# Patient Record
Sex: Female | Born: 1980 | Race: Black or African American | Hispanic: No | Marital: Married | Smoking: Never smoker
Health system: Southern US, Community
[De-identification: ages and names within clinical notes are randomized; demographics above are authoritative.]

## PROBLEM LIST (undated history)

## (undated) DIAGNOSIS — J45909 Unspecified asthma, uncomplicated: Secondary | ICD-10-CM

## (undated) DIAGNOSIS — F419 Anxiety disorder, unspecified: Secondary | ICD-10-CM

## (undated) DIAGNOSIS — Z973 Presence of spectacles and contact lenses: Secondary | ICD-10-CM

## (undated) DIAGNOSIS — F32A Depression, unspecified: Secondary | ICD-10-CM

## (undated) DIAGNOSIS — D509 Iron deficiency anemia, unspecified: Secondary | ICD-10-CM

## (undated) DIAGNOSIS — M26629 Arthralgia of temporomandibular joint, unspecified side: Secondary | ICD-10-CM

## (undated) DIAGNOSIS — N840 Polyp of corpus uteri: Secondary | ICD-10-CM

## (undated) DIAGNOSIS — R011 Cardiac murmur, unspecified: Secondary | ICD-10-CM

## (undated) DIAGNOSIS — J302 Other seasonal allergic rhinitis: Secondary | ICD-10-CM

## (undated) DIAGNOSIS — N921 Excessive and frequent menstruation with irregular cycle: Secondary | ICD-10-CM

## (undated) DIAGNOSIS — F329 Major depressive disorder, single episode, unspecified: Secondary | ICD-10-CM

## (undated) DIAGNOSIS — Z9884 Bariatric surgery status: Secondary | ICD-10-CM

## (undated) DIAGNOSIS — K219 Gastro-esophageal reflux disease without esophagitis: Secondary | ICD-10-CM

## (undated) HISTORY — PX: WISDOM TOOTH EXTRACTION: SHX21

## (undated) HISTORY — PX: LAPAROSCOPIC GASTRIC SLEEVE RESECTION: SHX5895

---

## 2012-03-10 ENCOUNTER — Other Ambulatory Visit: Payer: Self-pay | Admitting: Allergy

## 2012-03-10 ENCOUNTER — Ambulatory Visit
Admission: RE | Admit: 2012-03-10 | Discharge: 2012-03-10 | Disposition: A | Discharge status: Home / Self Care | Payer: BC Managed Care – PPO | Source: Ambulatory Visit | Attending: Allergy | Admitting: Allergy

## 2012-03-10 DIAGNOSIS — J209 Acute bronchitis, unspecified: Secondary | ICD-10-CM

## 2014-01-04 ENCOUNTER — Other Ambulatory Visit: Payer: Self-pay | Admitting: Obstetrics & Gynecology

## 2014-01-12 ENCOUNTER — Encounter (HOSPITAL_COMMUNITY): Payer: Self-pay

## 2014-01-12 ENCOUNTER — Encounter (HOSPITAL_COMMUNITY)
Admission: RE | Admit: 2014-01-12 | Discharge: 2014-01-12 | Disposition: A | Discharge status: Home / Self Care | Payer: 59 | Source: Ambulatory Visit | Attending: Obstetrics & Gynecology | Admitting: Obstetrics & Gynecology

## 2014-01-12 DIAGNOSIS — Z01812 Encounter for preprocedural laboratory examination: Secondary | ICD-10-CM | POA: Diagnosis present

## 2014-01-12 HISTORY — DX: Anxiety disorder, unspecified: F41.9

## 2014-01-12 HISTORY — DX: Other seasonal allergic rhinitis: J30.2

## 2014-01-12 HISTORY — DX: Depression, unspecified: F32.A

## 2014-01-12 HISTORY — DX: Major depressive disorder, single episode, unspecified: F32.9

## 2014-01-12 LAB — CBC
HCT: 35.4 % — ABNORMAL LOW (ref 36.0–46.0)
Hemoglobin: 11.8 g/dL — ABNORMAL LOW (ref 12.0–15.0)
MCH: 28.4 pg (ref 26.0–34.0)
MCHC: 33.3 g/dL (ref 30.0–36.0)
MCV: 85.1 fL (ref 78.0–100.0)
PLATELETS: 274 10*3/uL (ref 150–400)
RBC: 4.16 MIL/uL (ref 3.87–5.11)
RDW: 14.1 % (ref 11.5–15.5)
WBC: 8.1 10*3/uL (ref 4.0–10.5)

## 2014-01-12 NOTE — Patient Instructions (Addendum)
   Your procedure is scheduled on: Monday, Nov 16th  Enter through the Main Entrance of [facility] at: 11:30 AM Pick up the phone at the desk and dial [phone] and inform us of your arrival.  Please call this number if you have any problems the morning of surgery: [phone]  Remember:  Do not eat food after midnight: Sunday Do not drink clear liquids after: 6 AM Monday, day of surgery Take these medicines the morning of surgery with a SIP OF WATER: Ativan, flonase and Qvar Inhaler if needed.  Bring albuterol inhaler with you on day of surgery.  Do not wear jewelry, make-up, or FINGER nail polish No metal in your hair or on your body. Do not wear lotions, powders, perfumes.  You may wear deodorant.  Do not bring valuables to the hospital. Contacts, dentures or bridgework may not be worn into surgery.  Leave suitcase in the car. After Surgery it may be brought to your room. For patients being admitted to the hospital, checkout time is 11:00am the day of discharge.    Patients discharged on the day of surgery will not be allowed to drive home.  Home with partner Janett Billow cell [phone].

## 2014-01-15 MED ORDER — CEFAZOLIN SODIUM 10 G IJ SOLR
3.0000 g | INTRAMUSCULAR | Status: AC
  Administered 2014-01-16: 3 g via INTRAVENOUS
  Filled 2014-01-15: qty 3000

## 2014-01-16 ENCOUNTER — Ambulatory Visit (HOSPITAL_COMMUNITY)
Admission: RE | Admit: 2014-01-16 | Discharge: 2014-01-16 | Disposition: A | Discharge status: Home / Self Care | Payer: 59 | Source: Ambulatory Visit | Attending: Obstetrics & Gynecology | Admitting: Obstetrics & Gynecology

## 2014-01-16 ENCOUNTER — Ambulatory Visit (HOSPITAL_COMMUNITY): Payer: 59 | Admitting: Certified Registered Nurse Anesthetist

## 2014-01-16 ENCOUNTER — Encounter (HOSPITAL_COMMUNITY): Payer: Self-pay | Admitting: Certified Registered Nurse Anesthetist

## 2014-01-16 ENCOUNTER — Encounter (HOSPITAL_COMMUNITY)
Admission: RE | Disposition: A | Discharge status: Home / Self Care | Payer: Self-pay | Source: Ambulatory Visit | Attending: Obstetrics & Gynecology

## 2014-01-16 DIAGNOSIS — F329 Major depressive disorder, single episode, unspecified: Secondary | ICD-10-CM | POA: Diagnosis not present

## 2014-01-16 DIAGNOSIS — D271 Benign neoplasm of left ovary: Secondary | ICD-10-CM | POA: Diagnosis not present

## 2014-01-16 DIAGNOSIS — F419 Anxiety disorder, unspecified: Secondary | ICD-10-CM | POA: Insufficient documentation

## 2014-01-16 DIAGNOSIS — J45909 Unspecified asthma, uncomplicated: Secondary | ICD-10-CM | POA: Insufficient documentation

## 2014-01-16 DIAGNOSIS — D649 Anemia, unspecified: Secondary | ICD-10-CM | POA: Diagnosis not present

## 2014-01-16 DIAGNOSIS — N8320 Unspecified ovarian cysts: Secondary | ICD-10-CM | POA: Diagnosis present

## 2014-01-16 HISTORY — PX: ROBOTIC ASSISTED LAPAROSCOPIC OVARIAN CYSTECTOMY: SHX6081

## 2014-01-16 LAB — TYPE AND SCREEN
ABO/RH(D): O POS
ANTIBODY SCREEN: NEGATIVE

## 2014-01-16 LAB — PREGNANCY, URINE: Preg Test, Ur: NEGATIVE

## 2014-01-16 LAB — ABO/RH: ABO/RH(D): O POS

## 2014-01-16 SURGERY — ROBOTIC ASSISTED LAPAROSCOPIC OVARIAN CYSTECTOMY
Anesthesia: General | Site: Abdomen | Laterality: Left

## 2014-01-16 MED ORDER — MIDAZOLAM HCL 2 MG/2ML IJ SOLN
INTRAMUSCULAR | Status: DC | PRN
  Administered 2014-01-16: 2 mg via INTRAVENOUS

## 2014-01-16 MED ORDER — SCOPOLAMINE 1 MG/3DAYS TD PT72
MEDICATED_PATCH | TRANSDERMAL | Status: AC
  Filled 2014-01-16: qty 1

## 2014-01-16 MED ORDER — KETOROLAC TROMETHAMINE 30 MG/ML IJ SOLN
INTRAMUSCULAR | Status: AC
  Filled 2014-01-16: qty 1

## 2014-01-16 MED ORDER — PROPOFOL 10 MG/ML IV BOLUS
INTRAVENOUS | Status: DC | PRN
  Administered 2014-01-16: 200 mg via INTRAVENOUS

## 2014-01-16 MED ORDER — BUPIVACAINE HCL (PF) 0.25 % IJ SOLN
INTRAMUSCULAR | Status: AC
  Filled 2014-01-16: qty 60

## 2014-01-16 MED ORDER — FENTANYL CITRATE 0.05 MG/ML IJ SOLN
INTRAMUSCULAR | Status: AC
  Filled 2014-01-16: qty 5

## 2014-01-16 MED ORDER — MEPERIDINE HCL 25 MG/ML IJ SOLN: 6.2500 mg | INTRAMUSCULAR | Status: DC | PRN

## 2014-01-16 MED ORDER — NEOSTIGMINE METHYLSULFATE 10 MG/10ML IV SOLN
INTRAVENOUS | Status: DC | PRN
  Administered 2014-01-16: 5 mg via INTRAVENOUS

## 2014-01-16 MED ORDER — ARTIFICIAL TEARS OP OINT
TOPICAL_OINTMENT | OPHTHALMIC | Status: AC
  Filled 2014-01-16: qty 3.5

## 2014-01-16 MED ORDER — ONDANSETRON HCL 4 MG/2ML IJ SOLN
INTRAMUSCULAR | Status: DC | PRN
  Administered 2014-01-16: 4 mg via INTRAVENOUS

## 2014-01-16 MED ORDER — KETOROLAC TROMETHAMINE 30 MG/ML IJ SOLN
INTRAMUSCULAR | Status: DC | PRN
  Administered 2014-01-16: 30 mg via INTRAVENOUS

## 2014-01-16 MED ORDER — LIDOCAINE HCL (CARDIAC) 20 MG/ML IV SOLN
INTRAVENOUS | Status: DC | PRN
  Administered 2014-01-16: 100 mg via INTRAVENOUS

## 2014-01-16 MED ORDER — HYDROCODONE-IBUPROFEN 5-200 MG PO TABS: 1.0000 | ORAL_TABLET | Freq: Three times a day (TID) | ORAL | Status: DC | PRN

## 2014-01-16 MED ORDER — LACTATED RINGERS IR SOLN
Status: DC | PRN
  Administered 2014-01-16: 3000 mL

## 2014-01-16 MED ORDER — GLYCOPYRROLATE 0.2 MG/ML IJ SOLN
INTRAMUSCULAR | Status: AC
  Filled 2014-01-16: qty 4

## 2014-01-16 MED ORDER — PROPOFOL 10 MG/ML IV EMUL
INTRAVENOUS | Status: AC
  Filled 2014-01-16: qty 20

## 2014-01-16 MED ORDER — DEXAMETHASONE SODIUM PHOSPHATE 10 MG/ML IJ SOLN
INTRAMUSCULAR | Status: DC | PRN
  Administered 2014-01-16: 4 mg via INTRAVENOUS

## 2014-01-16 MED ORDER — MIDAZOLAM HCL 2 MG/2ML IJ SOLN
INTRAMUSCULAR | Status: AC
  Filled 2014-01-16: qty 2

## 2014-01-16 MED ORDER — ROCURONIUM BROMIDE 100 MG/10ML IV SOLN
INTRAVENOUS | Status: AC
  Filled 2014-01-16: qty 1

## 2014-01-16 MED ORDER — HYDROMORPHONE HCL 1 MG/ML IJ SOLN
INTRAMUSCULAR | Status: AC
  Filled 2014-01-16: qty 1

## 2014-01-16 MED ORDER — FENTANYL CITRATE 0.05 MG/ML IJ SOLN
INTRAMUSCULAR | Status: DC | PRN
  Administered 2014-01-16: 100 ug via INTRAVENOUS
  Administered 2014-01-16 (×3): 50 ug via INTRAVENOUS
  Administered 2014-01-16: 100 ug via INTRAVENOUS

## 2014-01-16 MED ORDER — KETOROLAC TROMETHAMINE 30 MG/ML IJ SOLN: 15.0000 mg | Freq: Once | INTRAMUSCULAR | Status: AC | PRN

## 2014-01-16 MED ORDER — SCOPOLAMINE 1 MG/3DAYS TD PT72
1.0000 | MEDICATED_PATCH | Freq: Once | TRANSDERMAL | Status: DC
  Administered 2014-01-16: 1.5 mg via TRANSDERMAL

## 2014-01-16 MED ORDER — FENTANYL CITRATE 0.05 MG/ML IJ SOLN
25.0000 ug | INTRAMUSCULAR | Status: DC | PRN
  Administered 2014-01-16: 25 ug via INTRAVENOUS
  Administered 2014-01-16: 50 ug via INTRAVENOUS
  Administered 2014-01-16: 25 ug via INTRAVENOUS

## 2014-01-16 MED ORDER — PROMETHAZINE HCL 25 MG/ML IJ SOLN: 6.2500 mg | INTRAMUSCULAR | Status: DC | PRN

## 2014-01-16 MED ORDER — DEXAMETHASONE SODIUM PHOSPHATE 4 MG/ML IJ SOLN
INTRAMUSCULAR | Status: AC
  Filled 2014-01-16: qty 1

## 2014-01-16 MED ORDER — LACTATED RINGERS IV SOLN
INTRAVENOUS | Status: DC
Start: 2014-01-16 — End: 2014-01-17
  Administered 2014-01-16 (×2): via INTRAVENOUS

## 2014-01-16 MED ORDER — VASOPRESSIN 20 UNIT/ML IV SOLN
INTRAVENOUS | Status: AC
  Filled 2014-01-16: qty 1

## 2014-01-16 MED ORDER — ONDANSETRON HCL 4 MG/2ML IJ SOLN
INTRAMUSCULAR | Status: AC
  Filled 2014-01-16: qty 2

## 2014-01-16 MED ORDER — HYDROMORPHONE HCL 1 MG/ML IJ SOLN: 0.2500 mg | INTRAMUSCULAR | Status: DC | PRN

## 2014-01-16 MED ORDER — HYDROMORPHONE HCL 1 MG/ML IJ SOLN
INTRAMUSCULAR | Status: DC | PRN
  Administered 2014-01-16 (×2): 0.5 mg via INTRAVENOUS

## 2014-01-16 MED ORDER — MIDAZOLAM HCL 2 MG/2ML IJ SOLN: 0.5000 mg | Freq: Once | INTRAMUSCULAR | Status: AC | PRN

## 2014-01-16 MED ORDER — NEOSTIGMINE METHYLSULFATE 10 MG/10ML IV SOLN
INTRAVENOUS | Status: AC
  Filled 2014-01-16: qty 1

## 2014-01-16 MED ORDER — FENTANYL CITRATE 0.05 MG/ML IJ SOLN
INTRAMUSCULAR | Status: AC
  Filled 2014-01-16: qty 2

## 2014-01-16 MED ORDER — FENTANYL CITRATE 0.05 MG/ML IJ SOLN
INTRAMUSCULAR | Status: AC
  Administered 2014-01-16: 25 ug via INTRAVENOUS
  Filled 2014-01-16: qty 2

## 2014-01-16 MED ORDER — ROCURONIUM BROMIDE 100 MG/10ML IV SOLN
INTRAVENOUS | Status: DC | PRN
  Administered 2014-01-16 (×2): 10 mg via INTRAVENOUS
  Administered 2014-01-16: 50 mg via INTRAVENOUS

## 2014-01-16 MED ORDER — GLYCOPYRROLATE 0.2 MG/ML IJ SOLN
INTRAMUSCULAR | Status: DC | PRN
  Administered 2014-01-16: .8 mg via INTRAVENOUS

## 2014-01-16 MED ORDER — BUPIVACAINE HCL (PF) 0.25 % IJ SOLN
INTRAMUSCULAR | Status: DC | PRN
  Administered 2014-01-16: 30 mL

## 2014-01-16 MED ORDER — LIDOCAINE HCL (CARDIAC) 20 MG/ML IV SOLN
INTRAVENOUS | Status: AC
  Filled 2014-01-16: qty 5

## 2014-01-16 SURGICAL SUPPLY — 62 items
BARRIER ADHS 3X4 INTERCEED (GAUZE/BANDAGES/DRESSINGS) IMPLANT
CHLORAPREP W/TINT 26ML (MISCELLANEOUS) ×4 IMPLANT
CLOTH BEACON ORANGE TIMEOUT ST (SAFETY) ×4 IMPLANT
CONT PATH 16OZ SNAP LID 3702 (MISCELLANEOUS) ×4 IMPLANT
COVER BACK TABLE 60X90IN (DRAPES) ×8 IMPLANT
COVER TIP SHEARS 8 DVNC (MISCELLANEOUS) ×2 IMPLANT
COVER TIP SHEARS 8MM DA VINCI (MISCELLANEOUS) ×2
DECANTER SPIKE VIAL GLASS SM (MISCELLANEOUS) ×4 IMPLANT
DRAPE HUG U DISPOSABLE (DRAPE) ×4 IMPLANT
DRAPE WARM FLUID 44X44 (DRAPE) ×4 IMPLANT
DRSG COVADERM PLUS 2X2 (GAUZE/BANDAGES/DRESSINGS) ×16 IMPLANT
DRSG OPSITE POSTOP 3X4 (GAUZE/BANDAGES/DRESSINGS) ×4 IMPLANT
ELECT REM PT RETURN 9FT ADLT (ELECTROSURGICAL) ×4
ELECTRODE REM PT RTRN 9FT ADLT (ELECTROSURGICAL) ×2 IMPLANT
EVACUATOR SMOKE 8.L (FILTER) ×4 IMPLANT
GAUZE SPONGE 4X4 16PLY XRAY LF (GAUZE/BANDAGES/DRESSINGS) ×4 IMPLANT
GAUZE VASELINE 3X9 (GAUZE/BANDAGES/DRESSINGS) IMPLANT
GLOVE BIO SURGEON STRL SZ 6.5 (GLOVE) ×3 IMPLANT
GLOVE BIO SURGEONS STRL SZ 6.5 (GLOVE) ×1
GLOVE BIOGEL PI IND STRL 7.0 (GLOVE) ×2 IMPLANT
GLOVE BIOGEL PI INDICATOR 7.0 (GLOVE) ×2
GOWN STRL REUS W/TWL LRG LVL3 (GOWN DISPOSABLE) ×32 IMPLANT
IV STOPCOCK 4 WAY 40  W/Y SET (IV SOLUTION) ×2
IV STOPCOCK 4 WAY 40 W/Y SET (IV SOLUTION) ×2 IMPLANT
KIT ACCESSORY DA VINCI DISP (KITS) ×2
KIT ACCESSORY DVNC DISP (KITS) ×2 IMPLANT
LIQUID BAND (GAUZE/BANDAGES/DRESSINGS) ×8 IMPLANT
NEEDLE HYPO 22GX1.5 SAFETY (NEEDLE) ×4 IMPLANT
OCCLUDER COLPOPNEUMO (BALLOONS) IMPLANT
PACK ROBOT WH (CUSTOM PROCEDURE TRAY) ×4 IMPLANT
PAD PREP 24X48 CUFFED NSTRL (MISCELLANEOUS) ×8 IMPLANT
PAD TRENDELENBURG POSITION (MISCELLANEOUS) ×4 IMPLANT
POUCH SPECIMEN RETRIEVAL 10MM (ENDOMECHANICALS) ×4 IMPLANT
PROTECTOR NERVE ULNAR (MISCELLANEOUS) ×8 IMPLANT
SET CYSTO W/LG BORE CLAMP LF (SET/KITS/TRAYS/PACK) IMPLANT
SET IRRIG TUBING LAPAROSCOPIC (IRRIGATION / IRRIGATOR) ×4 IMPLANT
SUT VIC AB 0 CT1 27 (SUTURE)
SUT VIC AB 0 CT1 27XBRD ANTBC (SUTURE) IMPLANT
SUT VIC AB 0 CT2 27 (SUTURE) IMPLANT
SUT VIC AB 2-0 CT1 27 (SUTURE)
SUT VIC AB 2-0 CT1 TAPERPNT 27 (SUTURE) IMPLANT
SUT VIC AB 3-0 SH 27 (SUTURE)
SUT VIC AB 3-0 SH 27X BRD (SUTURE) IMPLANT
SUT VIC AB 4-0 PS2 27 (SUTURE) ×4 IMPLANT
SUT VICRYL 0 UR6 27IN ABS (SUTURE) ×8 IMPLANT
SYR 50ML LL SCALE MARK (SYRINGE) ×4 IMPLANT
SYSTEM CONVERTIBLE TROCAR (TROCAR) IMPLANT
TIP UTERINE 5.1X6CM LAV DISP (MISCELLANEOUS) IMPLANT
TIP UTERINE 6.7X10CM GRN DISP (MISCELLANEOUS) IMPLANT
TIP UTERINE 6.7X6CM WHT DISP (MISCELLANEOUS) IMPLANT
TIP UTERINE 6.7X8CM BLUE DISP (MISCELLANEOUS) ×4 IMPLANT
TOWEL OR 17X24 6PK STRL BLUE (TOWEL DISPOSABLE) ×16 IMPLANT
TRAY FOLEY BAG SILVER LF 14FR (CATHETERS) ×4 IMPLANT
TROCAR 12M 150ML BLUNT (TROCAR) IMPLANT
TROCAR 5M 150ML BLDLS (TROCAR) ×4 IMPLANT
TROCAR DISP BLADELESS 8 DVNC (TROCAR) ×2 IMPLANT
TROCAR DISP BLADELESS 8MM (TROCAR) ×2
TROCAR OPTI TIP 12M 100M (ENDOMECHANICALS) IMPLANT
TROCAR XCEL 12X100 BLDLESS (ENDOMECHANICALS) ×4 IMPLANT
TROCAR XCEL NON-BLD 5MMX100MML (ENDOMECHANICALS) ×4 IMPLANT
WARMER LAPAROSCOPE (MISCELLANEOUS) ×4 IMPLANT
WATER STERILE IRR 1000ML POUR (IV SOLUTION) ×12 IMPLANT

## 2014-01-16 NOTE — Anesthesia Preprocedure Evaluation (Signed)
Anesthesia Evaluation  Patient identified by MRN, date of birth, ID band Patient awake    Reviewed: Allergy & Precautions, H&P , NPO status , Patient's Chart, lab work & pertinent test results  Airway Mallampati: I  TM Distance: >3 FB Neck ROM: full    Dental no notable dental hx. (+) Teeth Intact   Pulmonary    Pulmonary exam normal       Cardiovascular negative cardio ROS      Neuro/Psych    GI/Hepatic negative GI ROS, Neg liver ROS,   Endo/Other  Morbid obesity  Renal/GU negative Renal ROS     Musculoskeletal   Abdominal Normal abdominal exam  (+) + obese,   Peds  Hematology negative hematology ROS (+)   Anesthesia Other Findings   Reproductive/Obstetrics negative OB ROS                             Anesthesia Physical Anesthesia Plan  ASA: III  Anesthesia Plan: General   Post-op Pain Management:    Induction:   Airway Management Planned: Oral ETT  Additional Equipment:   Intra-op Plan:   Post-operative Plan: Extubation in OR  Informed Consent: I have reviewed the patients History and Physical, chart, labs and discussed the procedure including the risks, benefits and alternatives for the proposed anesthesia with the patient or authorized representative who has indicated his/her understanding and acceptance.   Dental Advisory Given  Plan Discussed with: CRNA and Surgeon  Anesthesia Plan Comments:         Anesthesia Quick Evaluation

## 2014-01-16 NOTE — Anesthesia Postprocedure Evaluation (Signed)
  Anesthesia Post Note  Patient: Patricia Giles  Procedure(s) Performed: Procedure(s) (LRB): ROBOTIC ASSISTED LAPAROSCOPIC OVARIAN CYSTECTOMY WITH PERITONEAL WASHINGS (Left)  Anesthesia type: GA  Patient location: PACU  Post pain: Pain level controlled  Post assessment: Post-op Vital signs reviewed  Last Vitals:  Filed Vitals:   01/16/14 1530  BP: 121/65  Pulse: 83  Temp:   Resp: 16    Post vital signs: Reviewed  Level of consciousness: sedated  Complications: No apparent anesthesia complications

## 2014-01-16 NOTE — Discharge Summary (Signed)
  Physician Discharge Summary  Patient ID: Patricia Giles MRN: [id number] DOB/AGE: Sep 07, 1980 33 y.o.  Admit date: 01/16/2014 Discharge date: 01/16/2014  Admission Diagnoses: Left complex ovarian cyst  Discharge Diagnoses: same, probable left Dermoid Ovarian Cyst        Active Problems:   * No active hospital problems. *   Discharged Condition: good  Hospital Course:  Outpatient   Consults: None  Treatments: surgery: Robotic Left Ovarian Cystectomy, Peritoneal washings  Disposition: Final discharge disposition not confirmed     Medication List    TAKE these medications        albuterol 108 (90 BASE) MCG/ACT inhaler  Commonly known as:  PROVENTIL HFA;VENTOLIN HFA  Inhale 1 puff into the lungs every 6 (six) hours as needed for wheezing or shortness of breath.     beclomethasone 40 MCG/ACT inhaler  Commonly known as:  QVAR  Inhale 2 puffs into the lungs 2 (two) times daily.     cetirizine 10 MG tablet  Commonly known as:  ZYRTEC  Take 10 mg by mouth daily.     fluticasone 50 MCG/ACT nasal spray  Commonly known as:  FLONASE  Place 1 spray into both nostrils every morning.     human chorionic gonadotropin 10000 UNITS injection  Commonly known as:  PREGNYL/NOVAREL  Inject 15 Units into the muscle 2 (two) times a week.     hydrocodone-ibuprofen 5-200 MG per tablet  Commonly known as:  VICOPROFEN  Take 1 tablet by mouth every 8 (eight) hours as needed for pain.     levocetirizine 5 MG tablet  Commonly known as:  XYZAL  Take 5 mg by mouth every evening.     LORazepam 1 MG tablet  Commonly known as:  ATIVAN  Take 1 mg by mouth daily as needed for anxiety.     meloxicam 15 MG tablet  Commonly known as:  MOBIC  Take 15 mg by mouth daily.     methocarbamol 500 MG tablet  Commonly known as:  ROBAXIN  Take 500 mg by mouth every 8 (eight) hours as needed for muscle spasms (pain).     phentermine 37.5 MG capsule  Take 37.5 mg by mouth every morning.     VITAMIN  B-12 IJ  Inject 30 Units as directed once a week.           Follow-up Information    Follow up with Ashna Dorough,MARIE-LYNE, MD In 3 weeks.   Specialty:  Obstetrics and Gynecology   Contact information:   [place] [address] [phone]       Signed: [name], MD 01/16/2014, 3:35 PM

## 2014-01-16 NOTE — H&P (Signed)
Patricia Giles is an 32 y.o. female G47P2A2  RP:  Left complex ovarian cyst, probable Dermoid for Robotic Left Ovarian Cystectomy  Pertinent Gynecological History: Menses: flow is moderate, regular Contraception: condoms Blood transfusions: none Sexually transmitted diseases: no past history Previous GYN Procedures: C/S  Last mammogram: None Last pap: normal OB History: G4P2A2  Menstrual History:  No LMP recorded.    Past Medical History  Diagnosis Date  . Seasonal allergies   . Asthma   . Anxiety   . Depression   . Cubital tunnel syndrome     both left and right  . Anemia     Past Surgical History  Procedure Laterality Date  . Cesarean section  2012    x 1 in [place]  . Wisdom tooth extraction      No family history on file.  Social History:  reports that she has never smoked. She has never used smokeless tobacco. She reports that she drinks alcohol. She reports that she does not use illicit drugs.  Allergies:  Allergies  Allergen Reactions  . Bismuth-Containing Compounds Hives    No prescriptions prior to admission    ROS  There were no vitals taken for this visit. Physical Exam   Pelvic US 4 cm complex left ovarian cyst c/w Dermoid persisting x 2014.  No FF.  Uterus wnl. No results found for this or any previous visit (from the past 24 hour(s)).  No results found.  Assessment/Plan: Lt complex ovarian cyst persisting at 4 cm, probable Dermoid.  Robotic Left ovarian cystectomy, possible Lt Oophorectomy.  Surgery and risks reviewed including trauma, infection, hemorrhage, DVT/PE, Anesthesia Cx.  Patricia Giles,Patricia Giles 01/16/2014, 10:29 AM

## 2014-01-16 NOTE — Discharge Instructions (Signed)
Ovarian Cystectomy, Care After  Refer to this sheet in the next few weeks. These instructions provide you with information on caring for yourself after your procedure. Your health care provider may also give you more specific instructions. Your treatment has been planned according to current medical practices, but problems sometimes occur. Call your health care provider if you have any problems or questions after your procedure.   WHAT TO EXPECT AFTER THE PROCEDURE  After your procedure, it is typical to have the following:  · Pain in your abdomen, especially at the incision sites. You will be given pain medicines to control the pain.  · Tiredness. This is a normal part of the recovery process. Your energy level will return to normal over the next several weeks.  · Constipation.  HOME CARE INSTRUCTIONS  · Only take over-the-counter or prescription medicines as directed by your health care provider. Avoid taking aspirin because it can cause bleeding.    · Follow your health care provider's instructions for when to resume your regular diet, exercise, and activities.    · Take rest breaks during the day as needed.  · Do not douche or have sexual intercourse until you have permission from your health care provider.    · Remove or change any bandages (dressings) as directed by your health care provider.    · Do not drive until your health care provider approves.    · Take showers instead of baths until your health care provider tells you otherwise.    · If you become constipated, you may:    ¨ Use a mild laxative if your health care provider approves.  ¨ Add more fruit and bran to your diet.  ¨ Drink more fluids.  · Take your temperature twice a day and record it.    · Do not drink alcohol while taking pain medicine.    · Try to have someone home with you for the first 1-2 weeks to help with your household activities.    · Follow up with your health care provider as directed.  SEEK MEDICAL CARE IF:  · You have a fever.     · You feel sick to your stomach (nauseous) and throw up (vomit).    · You have redness, swelling, or leakage of fluid at the incision site.    · You have pain when you urinate or have blood in your urine.    · You have a rash on your body.    · You have pain or redness where the IV tube was inserted.    · You have pain that is not relieved with medicine.    SEEK IMMEDIATE MEDICAL CARE IF:  · You have chest pain or shortness of breath.    · You feel dizzy or lightheaded.    · You have increasing abdominal pain that is not relieved with medicines.    · You have pain, swelling, or redness in your leg.    · You see a yellowish white fluid (pus) coming from the incision.    · Your incision is opening (edges not staying together).    Document Released: 12/08/2012 Document Reviewed: 12/08/2012  Exit[employer]® Patient Information ©2015 [employer]. This information is not intended to replace advice given to you by your health care provider. Make sure you discuss any questions you have with your health care provider.

## 2014-01-16 NOTE — Op Note (Signed)
01/16/2014  3:12 PM  PATIENT:  Patricia Giles  33 y.o. female  PRE-OPERATIVE DIAGNOSIS:  Left complex ovarian cyst  POST-OPERATIVE DIAGNOSIS:  Same, c/w Left Dermoid Ovarian Cyst  PROCEDURE:  Procedure(s): ROBOTIC ASSISTED LAPAROSCOPIC OVARIAN CYSTECTOMY WITH PERITONEAL WASHINGS  SURGEON:  Surgeon(s): [name], MD  ASSISTANTS: Julianne Handler   ANESTHESIA:   general   PROCEDURE:  Under general anesthesia with endotracheal intubation the patient is an lithotomy position. She is prepped with ChloraPrep on the abdomen and with Betadine on the suprapubic, vulvar and vaginal areas. The Foley is inserted in the bladder.  The weighted speculum is inserted in the vagina and the anterior lip of the cervix was grasped with a tenaculum. Hysterometry is 8 cm, so we used a #8 Rumi with the small coring, those are put in place easily. The weighted speculum and the tenaculum were removed.  Abdomen elderly we infiltrate the supraumbilical area with Marcaine one quarter plane. We make a 1.5 cm incision at that level with the scalpel.  The adipose tissue was dissected bluntly and we grasped the aponeurosis with 2 clamps.  We opened the aponeurosis under direct vision with a Mayo scissor. We opened the parietal peritoneum bluntly with the finger. A pursestring stitch of Vicryl 0 was done on the aponeurosis. We inserted the [facility] at that level and created a pneumoperitoneum with CO2.  Inspection of the abdominal pelvic cavities revealed no abdominal pathology, the pelvis revealed a left ovarian cyst about 4 cm, the right ovary, both tubes and the uterus were normal in size and appearance.  We then proceeded with port placement using a semicircular configuration.  2 robotic ports were put in place on the right and one assistant port on the upper left with a robotic ports on the lower left.  Infiltration of Marcaine 1/4 plain was done at all sites and then we used to scalpel to make the small incisions. All  ports were inserted under direct vision.  The robot was docked on the right side with the patient in deep Trendelenburg.  The instruments were entered under direct vision with the EndoShears scissor in the first arm, the PK in the second arm and the fenestrated clamp in the third arm. We then went to the console.  We started with peritoneal washings.  We then proceeded with the left ovarian cystectomy by making an incision contralateral to the mesosalpinx with the tip of the Endo Shears scissors.  We then proceeded with the left ovarian cystectomy using traction and countertraction.  The cyst wall was very thin and was unintentionally ruptured revealing the content of a dermoid cyst with fat and hair.  We suctioned the contents of the dermoid cyst as much as possible as we proceeded with the cystectomy.  We then put the left ovarian cyst in the posterior cutis sac.  Excellent hemostasis of the left ovary was achieved with the PK.  We took pictures before and after the cystectomy.  We then removed all robotic instruments. The robot was undocked.  We continued by laparoscopy with the 8 mm camera.  We used an Endobag to remove the left ovarian cyst and its content.  The specimen was sent to pathology.  We then irrigated and suctioned the abdominopelvic cavities abundantly to make sure that no content of the dermoid cyst with be left behind.  Hemostasis was adequate at all levels.  All laparoscopic instruments were removed.  The ports were removed under direct vision.  The CO2 was evacuated.  We closed the supraumbilical incision by attaching the pursestring stitch.  We completed hemostasis with the Bovie.  All incisions were closed with a subcuticular stitch of Vicryl 4-0. Dermabond was added on all incisions.  The Rumi and coring were removed from the vagina. The patient was brought to recovery room in good and stable status.  ESTIMATED BLOOD LOSS:  15 cc   Intake/Output Summary (Last 24 hours) at 01/16/14  1512 Last data filed at 01/16/14 1505  Gross per 24 hour  Intake   1000 ml  Output    325 ml  Net    675 ml     BLOOD ADMINISTERED:none   LOCAL MEDICATIONS USED:  MARCAINE     SPECIMEN:  Source of Specimen:  Left ovarian cyst c/w Dermoid cyst  DISPOSITION OF SPECIMEN:  PATHOLOGY  COUNTS:  YES  PLAN OF CARE: Transfer to PACU  [name]  01/16/2014  At 3:13 pm

## 2014-01-16 NOTE — Transfer of Care (Signed)
Immediate Anesthesia Transfer of Care Note  Patient: Patricia Giles  Procedure(s) Performed: Procedure(s): ROBOTIC ASSISTED LAPAROSCOPIC OVARIAN CYSTECTOMY WITH PERITONEAL WASHINGS (Left)  Patient Location: PACU  Anesthesia Type:General  Level of Consciousness: awake, alert  and oriented  Airway & Oxygen Therapy: Patient Spontanous Breathing and Patient connected to nasal cannula oxygen  Post-op Assessment: Report given to PACU RN and Post -op Vital signs reviewed and stable  Post vital signs: Reviewed and stable  Complications: No apparent anesthesia complications

## 2014-01-17 ENCOUNTER — Encounter (HOSPITAL_COMMUNITY): Payer: Self-pay | Admitting: Obstetrics & Gynecology

## 2014-04-20 ENCOUNTER — Other Ambulatory Visit: Payer: Self-pay

## 2014-04-20 DIAGNOSIS — E282 Polycystic ovarian syndrome: Secondary | ICD-10-CM

## 2014-04-20 DIAGNOSIS — R1032 Left lower quadrant pain: Secondary | ICD-10-CM

## 2014-05-01 ENCOUNTER — Other Ambulatory Visit: Payer: Self-pay

## 2014-05-07 ENCOUNTER — Encounter (HOSPITAL_BASED_OUTPATIENT_CLINIC_OR_DEPARTMENT_OTHER): Payer: Self-pay | Admitting: Emergency Medicine

## 2014-05-07 DIAGNOSIS — F329 Major depressive disorder, single episode, unspecified: Secondary | ICD-10-CM | POA: Insufficient documentation

## 2014-05-07 DIAGNOSIS — Z7951 Long term (current) use of inhaled steroids: Secondary | ICD-10-CM | POA: Insufficient documentation

## 2014-05-07 DIAGNOSIS — J45909 Unspecified asthma, uncomplicated: Secondary | ICD-10-CM | POA: Diagnosis not present

## 2014-05-07 DIAGNOSIS — Z79899 Other long term (current) drug therapy: Secondary | ICD-10-CM | POA: Insufficient documentation

## 2014-05-07 DIAGNOSIS — F419 Anxiety disorder, unspecified: Secondary | ICD-10-CM | POA: Insufficient documentation

## 2014-05-07 DIAGNOSIS — Z791 Long term (current) use of non-steroidal anti-inflammatories (NSAID): Secondary | ICD-10-CM | POA: Diagnosis not present

## 2014-05-07 DIAGNOSIS — D649 Anemia, unspecified: Secondary | ICD-10-CM | POA: Diagnosis not present

## 2014-05-07 DIAGNOSIS — Z3202 Encounter for pregnancy test, result negative: Secondary | ICD-10-CM | POA: Insufficient documentation

## 2014-05-07 DIAGNOSIS — Z8669 Personal history of other diseases of the nervous system and sense organs: Secondary | ICD-10-CM | POA: Insufficient documentation

## 2014-05-07 DIAGNOSIS — R51 Headache: Secondary | ICD-10-CM | POA: Insufficient documentation

## 2014-05-07 NOTE — ED Notes (Signed)
Pt reports headache onset x 1 day ago that has worsened to the point she has blurred vision nausea denies emesis

## 2014-05-08 ENCOUNTER — Emergency Department (HOSPITAL_BASED_OUTPATIENT_CLINIC_OR_DEPARTMENT_OTHER)
Admission: EM | Admit: 2014-05-08 | Discharge: 2014-05-08 | Disposition: A | Discharge status: Home / Self Care | Payer: 59 | Attending: Emergency Medicine | Admitting: Emergency Medicine

## 2014-05-08 DIAGNOSIS — R51 Headache: Secondary | ICD-10-CM

## 2014-05-08 DIAGNOSIS — R519 Headache, unspecified: Secondary | ICD-10-CM

## 2014-05-08 LAB — PREGNANCY, URINE: Preg Test, Ur: NEGATIVE

## 2014-05-08 MED ORDER — DIPHENHYDRAMINE HCL 50 MG/ML IJ SOLN
25.0000 mg | Freq: Once | INTRAMUSCULAR | Status: AC
  Administered 2014-05-08: 25 mg via INTRAVENOUS
  Filled 2014-05-08: qty 1

## 2014-05-08 MED ORDER — METOCLOPRAMIDE HCL 5 MG/ML IJ SOLN
10.0000 mg | Freq: Once | INTRAMUSCULAR | Status: AC
  Administered 2014-05-08: 10 mg via INTRAVENOUS
  Filled 2014-05-08: qty 2

## 2014-05-08 MED ORDER — KETOROLAC TROMETHAMINE 30 MG/ML IJ SOLN
30.0000 mg | Freq: Once | INTRAMUSCULAR | Status: AC
  Administered 2014-05-08: 30 mg via INTRAVENOUS
  Filled 2014-05-08: qty 1

## 2014-05-08 MED ORDER — IBUPROFEN 800 MG PO TABS: 800.0000 mg | ORAL_TABLET | Freq: Three times a day (TID) | ORAL | Status: DC

## 2014-05-08 NOTE — ED Notes (Signed)
Ginger ale given to patient. 

## 2014-05-08 NOTE — ED Provider Notes (Signed)
CSN: [id number]     Arrival date & time 05/07/14  2113 History  This chart was scribed for Patricia Caba K [name], MD by Molli Posey, ED Scribe. This patient was seen in room MH01/MH01 and the patient's care was started 12:19 AM.   Chief Complaint  Patient presents with  . Headache   Patient is a 34 y.o. female presenting with headaches. The history is provided by the patient. No language interpreter was used.  Headache Location: yesterday was right sided todays is on the left. Quality:  Dull Radiates to:  Does not radiate Severity currently:  8/10 Severity at highest:  8/10 Onset quality:  Gradual Duration:  1 day Timing:  Constant Progression:  Unchanged Context: emotional stress   Context: not activity, not exposure to bright light and not eating   Relieved by:  Nothing Worsened by:  Nothing Ineffective treatments:  None tried Associated symptoms: nausea   Associated symptoms: no abdominal pain, no back pain, no blurred vision, no congestion, no dizziness, no fever, no focal weakness, no neck pain, no neck stiffness, no numbness, no paresthesias, no photophobia, no seizures, no sore throat, no swollen glands, no vomiting and no weakness   Associated symptoms comment:  No change in cognitions no rashes on weakness nor numbness no changes in speech Risk factors: does not have insomnia    HPI Comments: Patricia Giles is a 34 y.o. female with a history of asthma -anemia, seasonal allergies and depression who presents to the Emergency Department complaining of a right sided HA that started yesterday. She rates her HA as a 8/10 at this time. Pt states her vision got blurry during as pt was driving at the time. She describes her HA as an ache and says it occasionally throbs. Pt states her HA is worse when she is moving. She says that she took hydrocodone yesterday which failed to relieve her symptoms. Pt says her HA is now located in her left temple region. Pt reports that her last period  started 05/01/14. Pt reports no alleviating factors at this time. She has taken nothing for symptoms is under a lot of stress at work.  No f/c/r.  No rashes no trauma.  No focal weakness.  No changes in speech or cognition   Past Medical History  Diagnosis Date  . Seasonal allergies   . Asthma   . Anxiety   . Depression   . Cubital tunnel syndrome     both left and right  . Anemia   . Complication of anesthesia    Past Surgical History  Procedure Laterality Date  . Cesarean section  2012    x 1 in [place]  . Wisdom tooth extraction    . Robotic assisted laparoscopic ovarian cystectomy Left 01/16/2014    Procedure: ROBOTIC ASSISTED LAPAROSCOPIC OVARIAN CYSTECTOMY WITH PERITONEAL WASHINGS;  Surgeon: [name], MD;  Location: [facility];  Service: Gynecology;  Laterality: Left;   History reviewed. No pertinent family history. History  Substance Use Topics  . Smoking status: Never Smoker   . Smokeless tobacco: Never Used  . Alcohol Use: Yes     Comment: socially   OB History    No data available     Review of Systems  Constitutional: Negative for fever.  HENT: Negative for congestion, drooling, sore throat, trouble swallowing and voice change.   Eyes: Negative for blurred vision and photophobia.  Gastrointestinal: Positive for nausea. Negative for vomiting and abdominal pain.  Musculoskeletal: Negative for back pain, neck pain  and neck stiffness.  Skin: Negative for rash.  Neurological: Positive for headaches. Negative for dizziness, focal weakness, seizures, syncope, facial asymmetry, speech difficulty, weakness, light-headedness, numbness and paresthesias.  Psychiatric/Behavioral: Negative for hallucinations.  All other systems reviewed and are negative.   Allergies  Bismuth-containing compounds  Home Medications   Prior to Admission medications   Medication Sig Start Date End Date Taking? Authorizing Provider  albuterol (PROVENTIL HFA;VENTOLIN HFA) 108 (90  BASE) MCG/ACT inhaler Inhale 1 puff into the lungs every 6 (six) hours as needed for wheezing or shortness of breath.    Historical Provider, MD  beclomethasone (QVAR) 40 MCG/ACT inhaler Inhale 2 puffs into the lungs 2 (two) times daily.    Historical Provider, MD  cetirizine (ZYRTEC) 10 MG tablet Take 10 mg by mouth daily.    Historical Provider, MD  Cyanocobalamin (VITAMIN B-12 IJ) Inject 30 Units as directed once a week.    Historical Provider, MD  fluticasone (FLONASE) 50 MCG/ACT nasal spray Place 1 spray into both nostrils every morning.    Historical Provider, MD  human chorionic gonadotropin (PREGNYL/NOVAREL) 10000 UNITS injection Inject 15 Units into the muscle 2 (two) times a week.    Historical Provider, MD  hydrocodone-ibuprofen (VICOPROFEN) 5-200 MG per tablet Take 1 tablet by mouth every 8 (eight) hours as needed for pain. 01/16/14   [name], MD  levocetirizine (XYZAL) 5 MG tablet Take 5 mg by mouth every evening.    Historical Provider, MD  LORazepam (ATIVAN) 1 MG tablet Take 1 mg by mouth daily as needed for anxiety.    Historical Provider, MD  meloxicam (MOBIC) 15 MG tablet Take 15 mg by mouth daily.    Historical Provider, MD  methocarbamol (ROBAXIN) 500 MG tablet Take 500 mg by mouth every 8 (eight) hours as needed for muscle spasms (pain).    Historical Provider, MD  phentermine 37.5 MG capsule Take 37.5 mg by mouth every morning.    Historical Provider, MD   BP 132/78 mmHg  Pulse 89  Temp(Src) 98 F (36.7 C) (Oral)  Resp 20  SpO2 100% Physical Exam  Constitutional: She is oriented to person, place, and time. She appears well-developed and well-nourished.  HENT:  Head: Normocephalic and atraumatic.  Right Ear: External ear normal.  Left Ear: External ear normal.  Mouth/Throat: Oropharynx is clear and moist. No oropharyngeal exudate.  Cranial nerves 2-12 intact.   Eyes: Conjunctivae and EOM are normal. Pupils are equal, round, and reactive to light. Right eye  exhibits no discharge. Left eye exhibits no discharge.  Fundoscopic exam:      The right eye shows no papilledema.       The left eye shows no papilledema.  Neck: Normal range of motion. Neck supple. No tracheal deviation present.  No meningismus  Cardiovascular: Normal rate, regular rhythm and normal heart sounds.   Pulmonary/Chest: Effort normal and breath sounds normal. No respiratory distress. She has no wheezes. She has no rales.  Abdominal: Soft. Bowel sounds are normal. She exhibits no distension and no mass. There is no tenderness. There is no rebound and no guarding.  Musculoskeletal: Normal range of motion. She exhibits no edema or tenderness.  Lymphadenopathy:    She has no cervical adenopathy.  Neurological: She is alert and oriented to person, place, and time. She has normal reflexes. She displays normal reflexes. No cranial nerve deficit or sensory deficit. She exhibits normal muscle tone. Coordination normal. GCS eye subscore is 4. GCS verbal subscore is 5. GCS  motor subscore is 6.  Intact gait, 5/5 x 4 extremities.  Intact cognition  Skin: Skin is warm and dry.  Psychiatric: She has a normal mood and affect.  Nursing note and vitals reviewed.   ED Course  Procedures   DIAGNOSTIC STUDIES: Oxygen Saturation is 100% on RA, normal by my interpretation.    COORDINATION OF CARE: 12:24 AM Discussed treatment plan with pt at bedside and pt agreed to plan.   Labs Review Labs Reviewed  PREGNANCY, URINE    Imaging Review No results found.   EKG Interpretation None      MDM   Final diagnoses:  None     Visual Acuity  Right Eye Distance:   Left Eye Distance:   Bilateral Distance:    Right Eye Near: R Near: 20/25 Left Eye Near:  L Near: 20/20 Bilateral Near:  20/25  No f/c/r.  Not sudden onset.  No changes in speech or cognition.  Highly doubt acute intracranial pathology.  Highly doubt meningitis without fever or stiff neck, highly doubt SAH as patient has  no neurologic deficits and has no meningismus and is seated in the room comfortably with the lights on, highly doubt SAH or cavernous sinus thrombosis as EOMI no papilledema and cognition is intact.  Follow up with your family doctor and neurology.  Strict return precautions given    I personally performed the services described in this documentation, which was scribed in my presence. The recorded information has been reviewed and is accurate.       Carlisle Beers, MD 05/08/14 [phone]

## 2014-05-31 ENCOUNTER — Ambulatory Visit (INDEPENDENT_AMBULATORY_CARE_PROVIDER_SITE_OTHER): Payer: 59 | Admitting: Neurology

## 2014-05-31 ENCOUNTER — Encounter: Payer: Self-pay | Admitting: Neurology

## 2014-05-31 VITALS — BP 118/88 | HR 99 | Resp 16 | Ht 70.0 in | Wt 334.0 lb

## 2014-05-31 DIAGNOSIS — R519 Headache, unspecified: Secondary | ICD-10-CM

## 2014-05-31 DIAGNOSIS — R51 Headache: Secondary | ICD-10-CM

## 2014-05-31 DIAGNOSIS — R200 Anesthesia of skin: Secondary | ICD-10-CM

## 2014-05-31 DIAGNOSIS — R42 Dizziness and giddiness: Secondary | ICD-10-CM

## 2014-05-31 LAB — CREATININE, SERUM: CREATININE: 0.69 mg/dL (ref 0.50–1.10)

## 2014-05-31 LAB — VITAMIN B12: VITAMIN B 12: 1206 pg/mL — AB (ref 211–911)

## 2014-05-31 LAB — BUN: BUN: 12 mg/dL (ref 6–23)

## 2014-05-31 NOTE — Progress Notes (Signed)
NEUROLOGY CONSULTATION NOTE  Seanna Sisler MRN: [id number] DOB: 1980-07-04  Referring provider: Dr. Antony Blackbird Primary care provider: Dr. Antony Blackbird  Reason for consult:  dizziness  Dear Dr Chapman Fitch:  Thank you for your kind referral of Patricia Giles for consultation of the above symptoms. Although her history is well known to you, please allow me to reiterate it for the purpose of our medical record.Records and images were personally reviewed where available.  HISTORY OF PRESENT ILLNESS: This is a 34 year old right-handed woman with no significant past medical history presenting for several neurological symptoms including numbness, dizziness, and headaches. She reports episodic numbness occurring on and off since high school from her left shoulder down to her left foot. This would last for a few minutes, initially occurring infrequently, but now more regular around once every other month. Sometimes she feels she cannot speak, she has a thought and tries to respond but "the words are there but just not coming out." Last episode was in January. She can be walking then her left leg goes numb and she loses balance. She has been told she would shift to the left side when walking, and has had to hold on to the wall. She feels the left forearm is constantly "like sandpaper" when she touches it. No facial symptoms. She occasionally has noticed difficulty gripping things with her left hand separate from the numbness episodes. Around a year ago, she started having dizzy episodes where she feels like she is floating. This can occur while sitting or standing, does not occur when lying down. Twice she had to sit down because the room felt like it was moving for 10-15 minutes. When more intense, she has some nausea. Minor episodes last only a few minutes. These occur a couple of times a month, last episode was a week ago. Over the past month, she started having headaches over the right temporal region,  described as unbearable stabbing pain. She has noticed her vision was affected, she could see clearly on the upper fields, but felt like the bottom visual field was shaking. She took pain medication and vision improved, her headache became more bearable, then came back worse the next day. It took 2 days to fully resolve. She has had smaller ones since then. She was started on Wellbutrin due to concern that headaches were due to depression. She has been having mild headaches daily, 2/10 in intensity, lasting 30-60 minutes. She has not taken any medication for these. No further vision changes, no nausea/vomiting. She denies any significant history of headaches except at age 32 or 2 she had 1 migraine and recalls being given a cocktail in the ER. She has intermittent tinnitus. She denies any diplopia, dysarthria, dysphagia, no neck/back pain, bowel/bladder dysfunction. She has noticed occasionally memory gaps since her late 67s, she would be driving and end up somewhere else, unsure how she got there. This has not affected her work, she just makes a lot of lists. She denies any staring/unresponsive episodes. She denies any myoclonic jerks. She takes Ativan prn for anxiety a few times a year. She was started on birth control pills a few weeks ago for PCOS. There is no family history of migraines. She had a normal birth and early development, no history of CNS infections, febrile convulsions, significant traumatic brain injury, family history of seizures.  Laboratory Data: Lab Results  Component Value Date   WBC 8.1 01/12/2014   HGB 11.8* 01/12/2014   HCT 35.4* 01/12/2014  MCV 85.1 01/12/2014   PLT 274 01/12/2014     Chemistry      Component Value Date/Time   BUN 12 05/31/2014 1142   CREATININE 0.69 05/31/2014 1142   No results found for: CALCIUM, ALKPHOS, AST, ALT, BILITOT      PAST MEDICAL HISTORY: Past Medical History  Diagnosis Date  . Seasonal allergies   . Asthma   . Anxiety   .  Depression   . Cubital tunnel syndrome     both left and right  . Anemia   . Complication of anesthesia     PAST SURGICAL HISTORY: Past Surgical History  Procedure Laterality Date  . Cesarean section  2012    x 1 in [place]  . Wisdom tooth extraction    . Robotic assisted laparoscopic ovarian cystectomy Left 01/16/2014    Procedure: ROBOTIC ASSISTED LAPAROSCOPIC OVARIAN CYSTECTOMY WITH PERITONEAL WASHINGS;  Surgeon: [name], MD;  Location: [facility];  Service: Gynecology;  Laterality: Left;    MEDICATIONS: Current Outpatient Prescriptions on File Prior to Visit  Medication Sig Dispense Refill  . albuterol (PROVENTIL HFA;VENTOLIN HFA) 108 (90 BASE) MCG/ACT inhaler Inhale 1 puff into the lungs every 6 (six) hours as needed for wheezing or shortness of breath.    . cetirizine (ZYRTEC) 10 MG tablet Take 10 mg by mouth daily.    . fluticasone (FLONASE) 50 MCG/ACT nasal spray Place 1 spray into both nostrils every morning.    . Fluticasone Furoate-Vilanterol (BREO ELLIPTA IN) Inhale into the lungs daily.     . hydrocodone-ibuprofen (VICOPROFEN) 5-200 MG per tablet Take 1 tablet by mouth every 8 (eight) hours as needed for pain. 30 tablet 0  . levocetirizine (XYZAL) 5 MG tablet Take 5 mg by mouth every evening.    Marland Kitchen LORazepam (ATIVAN) 1 MG tablet Take 1 mg by mouth daily as needed for anxiety.    . montelukast (SINGULAIR) 10 MG tablet Take 10 mg by mouth at bedtime.    Marland Kitchen ibuprofen (ADVIL,MOTRIN) 800 MG tablet Take 1 tablet (800 mg total) by mouth 3 (three) times daily. (Patient not taking: Reported on 05/31/2014) 21 tablet 0   No current facility-administered medications on file prior to visit.    ALLERGIES: Allergies  Allergen Reactions  . Bismuth-Containing Compounds Hives  . Adhesive [Tape] Rash    FAMILY HISTORY: No family history on file.  SOCIAL HISTORY: History   Social History  . Marital Status: Married    Spouse Name: N/A  . Number of Children: 1  . Years of  Education: N/A   Occupational History  . Mental Health    Social History Main Topics  . Smoking status: Never Smoker   . Smokeless tobacco: Never Used  . Alcohol Use: 0.0 oz/week    0 Standard drinks or equivalent per week     Comment: socially  . Drug Use: No  . Sexual Activity: Yes    Birth Control/ Protection: None   Other Topics Concern  . Not on file   Social History Narrative    REVIEW OF SYSTEMS: Constitutional: No fevers, chills, or sweats, no generalized fatigue, change in appetite Eyes: No visual changes, double vision, eye pain Ear, nose and throat: No hearing loss, ear pain, nasal congestion, sore throat Cardiovascular: No chest pain, palpitations Respiratory:  No shortness of breath at rest or with exertion, wheezes GastrointestinaI: No nausea, vomiting, diarrhea, abdominal pain, fecal incontinence Genitourinary:  No dysuria, urinary retention or frequency Musculoskeletal:  No neck pain, back pain  Integumentary: No rash, pruritus, skin lesions Neurological: as above Psychiatric: No depression, insomnia, anxiety Endocrine: No palpitations, fatigue, diaphoresis, mood swings, change in appetite, change in weight, increased thirst Hematologic/Lymphatic:  No anemia, purpura, petechiae. Allergic/Immunologic: no itchy/runny eyes, nasal congestion, recent allergic reactions, rashes  PHYSICAL EXAM: Filed Vitals:   05/31/14 1021  BP: 118/88  Pulse: 99  Resp: 16   General: No acute distress Head:  Normocephalic/atraumatic Eyes: Fundoscopic exam shows bilateral sharp discs, no vessel changes, exudates, or hemorrhages Neck: supple, no paraspinal tenderness, full range of motion Back: No paraspinal tenderness Heart: regular rate and rhythm Lungs: Clear to auscultation bilaterally. Vascular: No carotid bruits. Skin/Extremities: No rash, no edema Neurological Exam: Mental status: alert and oriented to person, place, and time, no dysarthria or aphasia, Fund of  knowledge is appropriate.  Recent and remote memory are intact.  Attention and concentration are normal.    Able to name objects and repeat phrases. Cranial nerves: CN I: not tested CN II: pupils equal, round and reactive to light, visual fields intact, fundi unremarkable. CN III, IV, VI:  full range of motion, no nystagmus, no ptosis CN V: decreased cold on left V1-2, decreased pin on left V1. Did not split with pin but split with tuning fork to the left CN VII: upper and lower face symmetric CN VIII: hearing intact to finger rub CN IX, X: gag intact, uvula midline CN XI: sternocleidomastoid and trapezius muscles intact CN XII: tongue midline Bulk & Tone: normal, no fasciculations. Motor: 5/5 throughout with no pronator drift. Sensation: reports increased pin on left UE, otherwise intact to light touch, cold, vibration and joint position sense.  No extinction to double simultaneous stimulation.  Romberg test negative Deep Tendon Reflexes: +2 on both UE, +1 on both LE, no ankle clonus Plantar responses: downgoing bilaterally Cerebellar: no incoordination on finger to nose, heel to shin. No dysdiadochokinesia Gait: narrow-based and steady, able to tandem walk adequately. Tremor: none  IMPRESSION: This is a 34 year old right-handed woman presenting with several neurological symptoms including recurrent episodes of left-sided numbness, new onset headaches, and dizziness. Her neurological exam shows patchy sensory changes. The etiology of her symptoms is unclear. In this age group, demyelinating disease is considered. Check B12 level. She reports difficulty speaking when the left-sided symptoms occur. Routine EEG will be ordered to assess for focal abnormalities that increase risk for recurrent seizures. She will follow-up after the tests.   Thank you for allowing me to participate in the care of this patient. Please do not hesitate to call for any questions or concerns.   Ellouise Newer,  M.D.  CC: Dr. Chapman Fitch

## 2014-05-31 NOTE — Patient Instructions (Signed)
1. Bloodwork for B12, BUN, creatinine 2. Schedule MRI brain with and without contrast 3. Schedule routine EEG 4. Follow-up after tests

## 2014-06-01 ENCOUNTER — Telehealth: Payer: Self-pay | Admitting: Family Medicine

## 2014-06-01 NOTE — Telephone Encounter (Signed)
-----   Message from Cameron Sprang, MD sent at 06/01/2014  8:21 AM EDT ----- Pls let her know B12 level and kidney function look good. Thanks

## 2014-06-01 NOTE — Telephone Encounter (Signed)
Lmovm to return my call. 

## 2014-06-01 NOTE — Telephone Encounter (Signed)
Patient returned my call. Notified her of results.

## 2014-06-06 ENCOUNTER — Encounter: Payer: Self-pay | Admitting: Neurology

## 2014-06-06 ENCOUNTER — Other Ambulatory Visit: Payer: 59

## 2014-06-06 DIAGNOSIS — R42 Dizziness and giddiness: Secondary | ICD-10-CM | POA: Insufficient documentation

## 2014-06-06 DIAGNOSIS — R51 Headache: Secondary | ICD-10-CM

## 2014-06-06 DIAGNOSIS — R200 Anesthesia of skin: Secondary | ICD-10-CM | POA: Insufficient documentation

## 2014-06-06 DIAGNOSIS — R519 Headache, unspecified: Secondary | ICD-10-CM | POA: Insufficient documentation

## 2014-06-14 ENCOUNTER — Ambulatory Visit (HOSPITAL_COMMUNITY)
Admission: RE | Admit: 2014-06-14 | Discharge: 2014-06-14 | Disposition: A | Discharge status: Home / Self Care | Payer: 59 | Source: Ambulatory Visit | Attending: Neurology | Admitting: Neurology

## 2014-06-14 DIAGNOSIS — H539 Unspecified visual disturbance: Secondary | ICD-10-CM | POA: Insufficient documentation

## 2014-06-14 DIAGNOSIS — R200 Anesthesia of skin: Secondary | ICD-10-CM | POA: Insufficient documentation

## 2014-06-14 DIAGNOSIS — R51 Headache: Secondary | ICD-10-CM | POA: Insufficient documentation

## 2014-06-14 DIAGNOSIS — R42 Dizziness and giddiness: Secondary | ICD-10-CM | POA: Insufficient documentation

## 2014-06-14 DIAGNOSIS — R2689 Other abnormalities of gait and mobility: Secondary | ICD-10-CM | POA: Insufficient documentation

## 2014-06-14 DIAGNOSIS — R202 Paresthesia of skin: Secondary | ICD-10-CM | POA: Insufficient documentation

## 2014-06-14 MED ORDER — GADOBENATE DIMEGLUMINE 529 MG/ML IV SOLN
20.0000 mL | Freq: Once | INTRAVENOUS | Status: AC | PRN
  Administered 2014-06-14: 20 mL via INTRAVENOUS

## 2014-06-22 ENCOUNTER — Ambulatory Visit (INDEPENDENT_AMBULATORY_CARE_PROVIDER_SITE_OTHER): Payer: No Typology Code available for payment source | Admitting: Neurology

## 2014-06-22 DIAGNOSIS — R200 Anesthesia of skin: Secondary | ICD-10-CM | POA: Diagnosis not present

## 2014-06-27 NOTE — Procedures (Signed)
ELECTROENCEPHALOGRAM REPORT  Date of Study: 06/22/2014  Patient's Name: Patricia Giles MRN: [id number] Date of Birth: 03/03/1981  Referring Provider: Dr. Ellouise Newer  Clinical History: This is a 34 year old woman with episodic numbness on the left side. Sometimes she feels she cannot speak, she has a thought and tries to respond but "the words are there but just not coming out."  Medications: Prn Ativan  Technical Summary: A multichannel digital EEG recording measured by the international 10-20 system with electrodes applied with paste and impedances below 5000 ohms performed in our laboratory with EKG monitoring in an awake and asleep patient.  Hyperventilation and photic stimulation were performed.  The digital EEG was referentially recorded, reformatted, and digitally filtered in a variety of bipolar and referential montages for optimal display.    Description: The patient is awake and asleep during the recording.  During maximal wakefulness, there is a symmetric, medium voltage 10 Hz posterior dominant rhythm that attenuates with eye opening.  The record is symmetric.  During drowsiness and sleep, there is an increase in theta slowing of the background.  Vertex waves and symmetric sleep spindles were seen.  Hyperventilation and photic stimulation did not elicit any abnormalities.  There were no epileptiform discharges or electrographic seizures seen.    EKG lead was unremarkable.  Impression: This awake and asleep EEG is normal.    Clinical Correlation: A normal EEG does not exclude a clinical diagnosis of epilepsy.  If further clinical questions remain, prolonged EEG may be helpful.  Clinical correlation is advised.   Ellouise Newer, M.D.

## 2014-06-28 ENCOUNTER — Ambulatory Visit: Payer: 59 | Admitting: Neurology

## 2014-06-29 ENCOUNTER — Telehealth: Payer: Self-pay | Admitting: Family Medicine

## 2014-06-29 NOTE — Telephone Encounter (Signed)
Patient notified of result. 

## 2014-06-29 NOTE — Telephone Encounter (Signed)
-----   Message from Cameron Sprang, MD sent at 06/27/2014  3:20 PM EDT ----- Pls let her know EEG is normal, thanks

## 2014-07-07 ENCOUNTER — Ambulatory Visit (INDEPENDENT_AMBULATORY_CARE_PROVIDER_SITE_OTHER): Payer: No Typology Code available for payment source | Admitting: Neurology

## 2014-07-07 ENCOUNTER — Encounter: Payer: Self-pay | Admitting: Neurology

## 2014-07-07 VITALS — BP 116/88 | HR 93 | Resp 16 | Ht 70.0 in | Wt 337.0 lb

## 2014-07-07 DIAGNOSIS — G43109 Migraine with aura, not intractable, without status migrainosus: Secondary | ICD-10-CM

## 2014-07-07 DIAGNOSIS — F329 Major depressive disorder, single episode, unspecified: Secondary | ICD-10-CM

## 2014-07-07 DIAGNOSIS — F32A Depression, unspecified: Secondary | ICD-10-CM

## 2014-07-07 DIAGNOSIS — R42 Dizziness and giddiness: Secondary | ICD-10-CM | POA: Diagnosis not present

## 2014-07-07 DIAGNOSIS — G43909 Migraine, unspecified, not intractable, without status migrainosus: Secondary | ICD-10-CM

## 2014-07-07 MED ORDER — SUMATRIPTAN SUCCINATE 25 MG PO TABS: ORAL_TABLET | ORAL | Status: DC

## 2014-07-07 MED ORDER — BUPROPION HCL ER (XL) 300 MG PO TB24: 300.0000 mg | ORAL_TABLET | Freq: Every day | ORAL | Status: DC

## 2014-07-07 NOTE — Patient Instructions (Addendum)
1. Switch to Buproprion XL 300mg  at bedtime 2. Take Imitrex 25mg  1 tablet at onset of headache, may take second dose after 2 hours. Do not take more than 3 a week 3. Refer to Vestibular Therapy for dizziness and gaze-stabilization exercises 4. Keep a calendar of your symptoms 5. Follow-up in 3 months

## 2014-07-07 NOTE — Progress Notes (Signed)
NEUROLOGY FOLLOW UP OFFICE NOTE  Patricia Giles [id number]  HISTORY OF PRESENT ILLNESS: I had the pleasure of seeing Patricia Giles in follow-up in the neurology clinic on 07/07/2014.  The patient was last seen 6 weeks ago for numbness, dizziness, and headaches. Records and images were personally reviewed where available. I personally reviewed MRI brain with and without contrast which was normal. Her routine EEG and B12 level were normal. She reports that the left-sided numbness has resolved. She continues to have headaches a couple of times a week. This may be associated with nausea. She took Ibuprofen 400mg  which made the pain tolerable, lasting for several hours. She had 2 dizzy spells yesterday, these occur at least once a week, independent of the headaches. They can occur when sitting or standing. Yesterday she felt like she was spinning. She was started on Bupropion 150mg  BID which has helped make her mood more stable, but this makes her drowsy.   HPI: This is a pleasant 34 yo RH woman with no significant past medical history who presented with several neurological symptoms including numbness, dizziness, and headaches. She reports episodic numbness occurring on and off since high school from her left shoulder down to her left foot. This would last for a few minutes, initially occurring infrequently, but now more regular around once every other month. Sometimes she feels she cannot speak, she has a thought and tries to respond but "the words are there but just not coming out." Last episode was in January 2016. She can be walking then her left leg goes numb and she loses balance. She has been told she would shift to the left side when walking, and has had to hold on to the wall. She feels the left forearm is constantly "like sandpaper" when she touches it. No facial symptoms. She occasionally has noticed difficulty gripping things with her left hand separate from the numbness episodes. Around a year  ago, she started having dizzy episodes where she feels like she is floating. This can occur while sitting or standing, does not occur when lying down. Twice she had to sit down because the room felt like it was moving for 10-15 minutes. When more intense, she has some nausea. Minor episodes last only a few minutes. These occur a couple of times a month, last episode was a week ago. In February 2016, she started having headaches over the right temporal region, described as unbearable stabbing pain. She has noticed her vision was affected, she could see clearly on the upper fields, but felt like the bottom visual field was shaking. She took pain medication and vision improved, her headache became more bearable, then came back worse the next day. It took 2 days to fully resolve. She has had smaller ones since then. She was started on Wellbutrin due to concern that headaches were due to depression. She has been having mild headaches daily, 2/10 in intensity, lasting 30-60 minutes. She has not taken any medication for these. No further vision changes, no nausea/vomiting. She denies any significant history of headaches except at age 13 or 34 she had 1 migraine and recalls being given a cocktail in the ER. She has intermittent tinnitus. She has noticed occasionally memory gaps since her late 19s, she would be driving and end up somewhere else, unsure how she got there. This has not affected her work, she just makes a lot of lists. She denies any staring/unresponsive episodes. She denies any myoclonic jerks. She takes Ativan prn for anxiety  a few times a year. She was started on birth control pills for PCOS. There is no family history of migraines. She had a normal birth and early development, no history of CNS infections, febrile convulsions, significant traumatic brain injury, family history of seizures.  PAST MEDICAL HISTORY: Past Medical History  Diagnosis Date  . Seasonal allergies   . Asthma   . Anxiety   .  Depression   . Cubital tunnel syndrome     both left and right  . Anemia   . Complication of anesthesia     MEDICATIONS: Current Outpatient Prescriptions on File Prior to Visit  Medication Sig Dispense Refill  . albuterol (PROVENTIL HFA;VENTOLIN HFA) 108 (90 BASE) MCG/ACT inhaler Inhale 1 puff into the lungs every 6 (six) hours as needed for wheezing or shortness of breath.    Marland Kitchen buPROPion (ZYBAN) 150 MG 12 hr tablet Take 150 mg by mouth 2 (two) times daily. Twice daily  1  . CAMILA 0.35 MG tablet Take 1 tablet by mouth daily. Daily  2  . cetirizine (ZYRTEC) 10 MG tablet Take 10 mg by mouth daily.    . fluticasone (FLONASE) 50 MCG/ACT nasal spray Place 1 spray into both nostrils every morning.    . Fluticasone Furoate-Vilanterol (BREO ELLIPTA IN) Inhale into the lungs daily.     Marland Kitchen ibuprofen (ADVIL,MOTRIN) 800 MG tablet Take 1 tablet (800 mg total) by mouth 3 (three) times daily. 21 tablet 0  . levocetirizine (XYZAL) 5 MG tablet Take 5 mg by mouth every evening.    Marland Kitchen LORazepam (ATIVAN) 1 MG tablet Take 1 mg by mouth daily as needed for anxiety.    . hydrocodone-ibuprofen (VICOPROFEN) 5-200 MG per tablet Take 1 tablet by mouth every 8 (eight) hours as needed for pain. (Patient not taking: Reported on 07/07/2014) 30 tablet 0  . montelukast (SINGULAIR) 10 MG tablet Take 10 mg by mouth at bedtime.     No current facility-administered medications on file prior to visit.    ALLERGIES: Allergies  Allergen Reactions  . Bismuth-Containing Compounds Hives  . Adhesive [Tape] Rash    FAMILY HISTORY: No family history on file.  SOCIAL HISTORY: History   Social History  . Marital Status: Married    Spouse Name: N/A  . Number of Children: 1  . Years of Education: N/A   Occupational History  . Mental Health    Social History Main Topics  . Smoking status: Never Smoker   . Smokeless tobacco: Never Used  . Alcohol Use: 0.0 oz/week    0 Standard drinks or equivalent per week     Comment:  socially  . Drug Use: No  . Sexual Activity: Yes    Birth Control/ Protection: None   Other Topics Concern  . Not on file   Social History Narrative    REVIEW OF SYSTEMS: Constitutional: No fevers, chills, or sweats, no generalized fatigue, change in appetite Eyes: No visual changes, double vision, eye pain Ear, nose and throat: No hearing loss, ear pain, nasal congestion, sore throat Cardiovascular: No chest pain, palpitations Respiratory:  No shortness of breath at rest or with exertion, wheezes GastrointestinaI: No nausea, vomiting, diarrhea, abdominal pain, fecal incontinence Genitourinary:  No dysuria, urinary retention or frequency Musculoskeletal:  No neck pain, back pain Integumentary: No rash, pruritus, skin lesions Neurological: as above Psychiatric: No depression, insomnia, anxiety Endocrine: No palpitations, fatigue, diaphoresis, mood swings, change in appetite, change in weight, increased thirst Hematologic/Lymphatic:  No anemia, purpura, petechiae. Allergic/Immunologic:  no itchy/runny eyes, nasal congestion, recent allergic reactions, rashes  PHYSICAL EXAM: Filed Vitals:   07/07/14 1404  BP: 116/88  Pulse: 93  Resp: 16   General: No acute distress Head:  Normocephalic/atraumatic Neck: supple, no paraspinal tenderness, full range of motion Heart:  Regular rate and rhythm Lungs:  Clear to auscultation bilaterally Back: No paraspinal tenderness Skin/Extremities: No rash, no edema Neurological Exam: alert and oriented to person, place, and time. No aphasia or dysarthria. Fund of knowledge is appropriate.  Recent and remote memory are intact.  Attention and concentration are normal.    Able to name objects and repeat phrases. Cranial nerves: Pupils equal, round, reactive to light.  Fundoscopic exam unremarkable, no papilledema. Extraocular movements intact with no nystagmus. Visual fields full. Facial sensation intact. No facial asymmetry. Tongue, uvula, palate  midline.  Motor: Bulk and tone normal, muscle strength 5/5 throughout with no pronator drift.  Sensation to light touch intact.  No extinction to double simultaneous stimulation.  Deep tendon reflexes +2 on both UE, +1 both LE, toes downgoing.  Finger to nose testing intact.  Gait narrow-based and steady, able to tandem walk adequately.  Romberg negative.  IMPRESSION: This is a pleasant 34 yo RH woman who presented with several neurological symptoms including recurrent episodes of left-sided numbness, new onset headaches, and dizziness. MRI brain normal. She reports the left-sided numbness has resolved. She continues to have headaches with some migrainous features, as well as dizziness. Consideration for complicated migraines/vertiginous migraines. She has been taking Buproprion, which may help with headaches. She feels sleepy and will switch to XL formulation to take qhs. She will try prn Imitrex for the headaches and knows to minimize any rescue medication to 2-3 a week to avoid rebound headaches. She will be referred for vestibular therapy for the dizziness and gaze stabilization exercises. She will keep a calendar of her symptoms and follow-up in 3 months.   Thank you for allowing me to participate in her care.  Please do not hesitate to call for any questions or concerns.  The duration of this appointment visit was 25 minutes of face-to-face time with the patient.  Greater than 50% of this time was spent in counseling, explanation of diagnosis, planning of further management, and coordination of care.   Ellouise Newer, M.D.   CC: Dr. Chapman Fitch

## 2014-07-14 DIAGNOSIS — F329 Major depressive disorder, single episode, unspecified: Secondary | ICD-10-CM | POA: Insufficient documentation

## 2014-07-14 DIAGNOSIS — F32A Depression, unspecified: Secondary | ICD-10-CM | POA: Insufficient documentation

## 2014-07-14 DIAGNOSIS — G43109 Migraine with aura, not intractable, without status migrainosus: Secondary | ICD-10-CM | POA: Insufficient documentation

## 2014-07-20 ENCOUNTER — Ambulatory Visit
Admission: RE | Admit: 2014-07-20 | Discharge: 2014-07-20 | Disposition: A | Discharge status: Home / Self Care | Payer: No Typology Code available for payment source | Source: Ambulatory Visit | Attending: Family Medicine | Admitting: Family Medicine

## 2014-07-20 ENCOUNTER — Other Ambulatory Visit: Payer: Self-pay | Admitting: Family Medicine

## 2014-07-20 DIAGNOSIS — R52 Pain, unspecified: Secondary | ICD-10-CM

## 2014-10-27 ENCOUNTER — Ambulatory Visit: Payer: No Typology Code available for payment source | Admitting: Neurology

## 2014-10-30 ENCOUNTER — Encounter: Payer: Self-pay | Admitting: Neurology

## 2014-10-30 ENCOUNTER — Ambulatory Visit (INDEPENDENT_AMBULATORY_CARE_PROVIDER_SITE_OTHER): Payer: No Typology Code available for payment source | Admitting: Neurology

## 2014-10-30 VITALS — BP 126/90 | HR 90 | Resp 16 | Ht 70.0 in | Wt 329.0 lb

## 2014-10-30 DIAGNOSIS — R42 Dizziness and giddiness: Secondary | ICD-10-CM

## 2014-10-30 DIAGNOSIS — F32A Depression, unspecified: Secondary | ICD-10-CM

## 2014-10-30 DIAGNOSIS — G43109 Migraine with aura, not intractable, without status migrainosus: Secondary | ICD-10-CM

## 2014-10-30 DIAGNOSIS — F329 Major depressive disorder, single episode, unspecified: Secondary | ICD-10-CM

## 2014-10-30 DIAGNOSIS — G43909 Migraine, unspecified, not intractable, without status migrainosus: Secondary | ICD-10-CM | POA: Diagnosis not present

## 2014-10-30 NOTE — Patient Instructions (Signed)
1. Continue all your medications 2. Keep a calendar of your headaches, try to identify any triggers 3. Follow-up in 5-6 months, call for any changes

## 2014-10-30 NOTE — Progress Notes (Signed)
NEUROLOGY FOLLOW UP OFFICE NOTE  Patricia Giles [id number]  HISTORY OF PRESENT ILLNESS: I had the pleasure of seeing Patricia Giles in follow-up in the neurology clinic on 10/30/2014.  The patient was last seen 3 months ago for numbness, dizziness, and headaches. Since her last visit, she has switched to Bupropion XL, which causes less sedation. She reports headaches and dizziness are much better. She mostly has headaches when stressed out, no more than once a week, and does not take any prn medication except for twice when she took Imitrex, with good effect when taken early. She denies any further focal numbness/tingling, no vision changes. She reports her appetite is much better, she is losing weight. She denies any falls.  HPI: This is a pleasant 34 yo RH woman with no significant past medical history who presented with several neurological symptoms including numbness, dizziness, and headaches. She reports episodic numbness occurring on and off since high school from her left shoulder down to her left foot. This would last for a few minutes, initially occurring infrequently, but now more regular around once every other month. Sometimes she feels she cannot speak, she has a thought and tries to respond but "the words are there but just not coming out." Last episode was in January 2016. She can be walking then her left leg goes numb and she loses balance. She has been told she would shift to the left side when walking, and has had to hold on to the wall. She feels the left forearm is constantly "like sandpaper" when she touches it. No facial symptoms. She occasionally has noticed difficulty gripping things with her left hand separate from the numbness episodes. Around a year ago, she started having dizzy episodes where she feels like she is floating. This can occur while sitting or standing, does not occur when lying down. Twice she had to sit down because the room felt like it was moving for 10-15  minutes. When more intense, she has some nausea. Minor episodes last only a few minutes. These occur a couple of times a month, last episode was a week ago. In February 2016, she started having headaches over the right temporal region, described as unbearable stabbing pain. She has noticed her vision was affected, she could see clearly on the upper fields, but felt like the bottom visual field was shaking. She took pain medication and vision improved, her headache became more bearable, then came back worse the next day. It took 2 days to fully resolve. She has had smaller ones since then. She was started on Wellbutrin due to concern that headaches were due to depression. She has been having mild headaches daily, 2/10 in intensity, lasting 30-60 minutes. She has not taken any medication for these. No further vision changes, no nausea/vomiting. She denies any significant history of headaches except at age 51 or 49 she had 1 migraine and recalls being given a cocktail in the ER. She has intermittent tinnitus. She has noticed occasionally memory gaps since her late 65s, she would be driving and end up somewhere else, unsure how she got there. This has not affected her work, she just makes a lot of lists. She denies any staring/unresponsive episodes. She denies any myoclonic jerks. She takes Ativan prn for anxiety a few times a year. She was started on birth control pills for PCOS. There is no family history of migraines. She had a normal birth and early development, no history of CNS infections, febrile convulsions, significant traumatic brain  injury, family history of seizures.  Records and images were personally reviewed where available. I personally reviewed MRI brain with and without contrast which was normal. Her routine EEG and B12 level were normal. She reports that the left-sided numbness has resolved. She continues to have headaches a couple of times a week. This may be associated with nausea. She took  Ibuprofen 400mg  which made the pain tolerable, lasting for several hours. She had 2 dizzy spells yesterday, these occur at least once a week, independent of the headaches. They can occur when sitting or standing. Yesterday she felt like she was spinning. She was started on Bupropion 150mg  BID which has helped make her mood more stable, but this makes her drowsy.   PAST MEDICAL HISTORY: Past Medical History  Diagnosis Date  . Seasonal allergies   . Asthma   . Anxiety   . Depression   . Cubital tunnel syndrome     both left and right  . Anemia   . Complication of anesthesia     MEDICATIONS: Current Outpatient Prescriptions on File Prior to Visit  Medication Sig Dispense Refill  . albuterol (PROVENTIL HFA;VENTOLIN HFA) 108 (90 BASE) MCG/ACT inhaler Inhale 1 puff into the lungs every 6 (six) hours as needed for wheezing or shortness of breath.    Marland Kitchen buPROPion (WELLBUTRIN XL) 300 MG 24 hr tablet Take 1 tablet (300 mg total) by mouth daily. 30 tablet 4  . CAMILA 0.35 MG tablet Take 1 tablet by mouth daily. Daily  2  . cetirizine (ZYRTEC) 10 MG tablet Take 10 mg by mouth daily.    . fluticasone (FLONASE) 50 MCG/ACT nasal spray Place 1 spray into both nostrils every morning.    . Fluticasone Furoate-Vilanterol (BREO ELLIPTA IN) Inhale into the lungs daily.     Marland Kitchen ibuprofen (ADVIL,MOTRIN) 800 MG tablet Take 1 tablet (800 mg total) by mouth 3 (three) times daily. 21 tablet 0  . levocetirizine (XYZAL) 5 MG tablet Take 5 mg by mouth every evening.    . montelukast (SINGULAIR) 10 MG tablet Take 10 mg by mouth at bedtime.    Marland Kitchen LORazepam (ATIVAN) 1 MG tablet Take 1 mg by mouth daily as needed for anxiety.    . SUMAtriptan (IMITREX) 25 MG tablet Take 1 tablet at onset of headache, may take second dose after 2 hours if needed. Do not take more than 3 a week (Patient not taking: Reported on 10/30/2014) 10 tablet 4   No current facility-administered medications on file prior to visit.     ALLERGIES: Allergies  Allergen Reactions  . Bismuth-Containing Compounds Hives  . Adhesive [Tape] Rash    FAMILY HISTORY: No family history on file.  SOCIAL HISTORY: Social History   Social History  . Marital Status: Married    Spouse Name: N/A  . Number of Children: 1  . Years of Education: N/A   Occupational History  . Mental Health    Social History Main Topics  . Smoking status: Never Smoker   . Smokeless tobacco: Never Used  . Alcohol Use: 0.0 oz/week    0 Standard drinks or equivalent per week     Comment: socially  . Drug Use: No  . Sexual Activity: Yes    Birth Control/ Protection: None   Other Topics Concern  . Not on file   Social History Narrative    REVIEW OF SYSTEMS: Constitutional: No fevers, chills, or sweats, no generalized fatigue, change in appetite Eyes: No visual changes, double vision, eye  pain Ear, nose and throat: No hearing loss, ear pain, nasal congestion, sore throat Cardiovascular: No chest pain, palpitations Respiratory:  No shortness of breath at rest or with exertion, wheezes GastrointestinaI: No nausea, vomiting, diarrhea, abdominal pain, fecal incontinence Genitourinary:  No dysuria, urinary retention or frequency Musculoskeletal:  No neck pain, back pain Integumentary: No rash, pruritus, skin lesions Neurological: as above Psychiatric: No depression, insomnia, anxiety Endocrine: No palpitations, fatigue, diaphoresis, mood swings, change in appetite, change in weight, increased thirst Hematologic/Lymphatic:  No anemia, purpura, petechiae. Allergic/Immunologic: no itchy/runny eyes, nasal congestion, recent allergic reactions, rashes  PHYSICAL EXAM: Filed Vitals:   10/30/14 1549  BP: 126/90  Pulse: 90  Resp: 16   General: No acute distress Head:  Normocephalic/atraumatic Neck: supple, no paraspinal tenderness, full range of motion Heart:  Regular rate and rhythm Lungs:  Clear to auscultation bilaterally Back: No  paraspinal tenderness Skin/Extremities: No rash, no edema Neurological Exam: alert and oriented to person, place, and time. No aphasia or dysarthria. Fund of knowledge is appropriate.  Recent and remote memory are intact.  Attention and concentration are normal.    Able to name objects and repeat phrases. Cranial nerves: Pupils equal, round, reactive to light.  Fundoscopic exam unremarkable, no papilledema. Extraocular movements intact with no nystagmus. Visual fields full. Facial sensation intact. No facial asymmetry. Tongue, uvula, palate midline.  Motor: Bulk and tone normal, muscle strength 5/5 throughout with no pronator drift.  Sensation to light touch intact.  No extinction to double simultaneous stimulation.  Deep tendon reflexes 2+ throughout, toes downgoing.  Finger to nose testing intact.  Gait narrow-based and steady, able to tandem walk adequately.  Romberg negative.  IMPRESSION: This is a pleasant 34 yo RH woman who presented with several neurological symptoms including recurrent episodes of left-sided numbness, new onset headaches, and dizziness. MRI brain normal. She continued to have headaches with some migrainous features, as well as dizziness. Consideration for complicated migraines/vertiginous migraines. Left-sided paresthesias have not recurred. She reports improvement of headaches and dizziness with Bupropion and will continue current medications. She will keep a calendar of her headaches and knows to minimize any rescue medication to 2-3 a week to avoid rebound headaches. She will follow-up in 5-6 months and knows to call our office for any changes.   Thank you for allowing me to participate in her care.  Please do not hesitate to call for any questions or concerns.  The duration of this appointment visit was 15 minutes of face-to-face time with the patient.  Greater than 50% of this time was spent in counseling, explanation of diagnosis, planning of further management, and  coordination of care.   Ellouise Newer, M.D.   CC: Dr. Chapman Fitch

## 2014-11-01 ENCOUNTER — Encounter: Payer: Self-pay | Admitting: Neurology

## 2014-11-21 ENCOUNTER — Ambulatory Visit: Payer: No Typology Code available for payment source

## 2014-11-21 ENCOUNTER — Ambulatory Visit
Payer: No Typology Code available for payment source | Attending: Neurology | Admitting: Rehabilitative and Restorative Service Providers"

## 2014-11-21 DIAGNOSIS — R42 Dizziness and giddiness: Secondary | ICD-10-CM | POA: Insufficient documentation

## 2014-11-21 DIAGNOSIS — R269 Unspecified abnormalities of gait and mobility: Secondary | ICD-10-CM | POA: Diagnosis present

## 2014-11-21 NOTE — Patient Instructions (Signed)
Tip Card 1.The goal of habituation training is to assist in decreasing symptoms of vertigo, dizziness, or nausea provoked by specific head and body motions. 2.These exercises may initially increase symptoms; however, be persistent and work through symptoms. With repetition and time, the exercises will assist in reducing or eliminating symptoms. 3.Exercises should be stopped and discussed with the therapist if you experience any of the following: - Sudden change or fluctuation in hearing - New onset of ringing in the ears, or increase in current intensity - Any fluid discharge from the ear - Severe pain in neck or back - Extreme nausea  Copyright  VHI. All rights reserved.   Copyright  VHI. All rights reserved.  Gaze Stabilization: Tip Card 1.Target must remain in focus, not blurry, and appear stationary while head is in motion. 2.Perform exercises with small head movements (45 to either side of midline). 3.Increase speed of head motion so long as target is in focus. 4.If you wear eyeglasses, be sure you can see target through lens (therapist will give specific instructions for bifocal / progressive lenses). 5.These exercises may provoke dizziness or nausea. Work through these symptoms. If too dizzy, slow head movement slightly. Rest between each exercise. 6.Exercises demand concentration; avoid distractions. 7.For safety, perform standing exercises close to a counter, wall, corner, or next to someone.  Copyright  VHI. All rights reserved.  Gaze Stabilization: Standing Feet Apart   Feet shoulder width apart, keeping eyes on target on wall 3 feet away, tilt head down slightly and move head side to side for 30 seconds. Repeat while moving head up and down for 30 seconds. Do 2 sessions per day.   Copyright  VHI. All rights reserved.   *General tension stretches when in front of the computer: Shoulder rolls

## 2014-11-22 ENCOUNTER — Telehealth: Payer: Self-pay | Admitting: Neurology

## 2014-11-22 ENCOUNTER — Other Ambulatory Visit: Payer: Self-pay | Admitting: *Deleted

## 2014-11-22 DIAGNOSIS — H9319 Tinnitus, unspecified ear: Secondary | ICD-10-CM

## 2014-11-22 NOTE — Telephone Encounter (Signed)
Does she had an ear doctor? If not, pls let her know that she should see ENT, pls send referral to ENT for tinnitus. Thanks

## 2014-11-22 NOTE — Telephone Encounter (Signed)
Referral sent to ENT per patient's request.

## 2014-11-22 NOTE — Telephone Encounter (Signed)
Pt called about both ears ringing since last week/call back @ [phone]

## 2014-11-22 NOTE — Therapy (Signed)
[facility] [address] [place], [place], [address] Phone: [phone]   Fax:  [phone]  Physical Therapy Evaluation  Patient Details  Name: Patricia Giles MRN: [id number] Date of Birth: [birth year] Referring Provider:  Cameron Sprang, MD  Encounter Date: 11/21/2014      PT End of Session - 11/21/14 0747    Visit Number 1   Number of Visits 6   Date for PT Re-Evaluation 01/04/15   Authorization Type private insurance/medcost   PT Start Time 1150   PT Stop Time 1230   PT Time Calculation (min) 40 min   Activity Tolerance Patient tolerated treatment well   Behavior During Therapy [facility] for tasks assessed/performed      Past Medical History  Diagnosis Date  . Seasonal allergies   . Asthma   . Anxiety   . Depression   . Cubital tunnel syndrome     both left and right  . Anemia   . Complication of anesthesia     Past Surgical History  Procedure Laterality Date  . Cesarean section  2012    x 1 in [place]  . Wisdom tooth extraction    . Robotic assisted laparoscopic ovarian cystectomy Left 01/16/2014    Procedure: ROBOTIC ASSISTED LAPAROSCOPIC OVARIAN CYSTECTOMY WITH PERITONEAL WASHINGS;  Surgeon: [name], MD;  Location: [facility];  Service: Gynecology;  Laterality: Left;    There were no vitals filed for this visit.  Visit Diagnosis:  Dizziness and giddiness  Abnormality of gait      Subjective Assessment - 11/21/14 1155    Subjective The patient reports onset of dizziness related to use of computer at work noted as "drunk sensation" with visual movement and hard to focus + spinning and nausea.  The sensation lasts for minutes to an hour and occurs a few times per week.     Pertinent History Recent diagnosis of migraine with pain, numbness in arm and fingers.  She also notes headaches occasionally.   Patient Stated Goals Reduce dizziness.   Currently in Pain? Yes   Pain Score 8    Pain  Location Shoulder   Pain Orientation Left;Right  worse on left, but occurs bilaterally   Pain Descriptors / Indicators Aching   Pain Type Chronic pain   Pain Onset More than a month ago   Pain Frequency Intermittent   Aggravating Factors  tension, and at computer for long periods   Pain Relieving Factors stretching, massage, hot shower            [facility] PT Assessment - 11/21/14 1201    Assessment   Medical Diagnosis complicated migraine, dizziness   Onset Date/Surgical Date --  11/2013   Hand Dominance Right   Prior Therapy none   Balance Screen   Has the patient fallen in the past 6 months No   Has the patient had a decrease in activity level because of a fear of falling?  No  "wall has caught me sometimes"   Is the patient reluctant to leave their home because of a fear of falling?  No   Home Environment   Living Environment Private residence   Living Arrangements Spouse/significant other;Children  1 daughter, 77 year old   Type of Carefree Access Level entry   Prior Function   Level of Independence Independent   Vocation Full time employment   Vocation Requirements --  computer use intermittently  Vestibular Assessment - 11/21/14 1204    Vestibular Assessment   General Observation walks indep without device   Symptom Behavior   Type of Dizziness Blurred vision  hard to focus, blurred vision   Frequency of Dizziness few days per week   Duration of Dizziness minutes to hours   Aggravating Factors --  computer, looking down   Relieving Factors No known relieving factors   Occulomotor Exam   Occulomotor Alignment Normal   Spontaneous Absent   Gaze-induced Absent   Smooth Pursuits Intact   Saccades Intact   Comment patient wears glasses for distance, and has contacts   Vestibulo-Occular Reflex   VOR 1 Head Only (x 1 viewing) Patient performs 10 reps and has difficulty "focusing"on target   Comment HEP for gaze provokes visual blurring    Positional Testing   Sidelying Test Sidelying Right;Sidelying Left   Horizontal Canal Testing Horizontal Canal Right;Horizontal Canal Left   Sidelying Right   Sidelying Right Duration baseline to start was 2/10 with no increase   Sidelying Right Symptoms No nystagmus   Sidelying Left   Sidelying Left Duration wavy sensation that lasts while in provoking position   Sidelying Left Symptoms No nystagmus  5/10 symptoms first rep, none provoked second rep   Horizontal Canal Right   Horizontal Canal Right Duration 3/10 symptoms that last while in position wit onset of nausea at this point in evaluation.  Returned to lying supine and let patient rest x 2-3 minutes   Horizontal Canal Right Symptoms Normal   Horizontal Canal Left   Horizontal Canal Left Symptoms Normal                Vestibular Treatment/Exercise - 11/21/14 1217    Vestibular Treatment/Exercise   Vestibular Treatment Provided Habituation;Gaze   Habituation Exercises Seated Horizontal Head Turns   Gaze Exercises X1 Viewing Horizontal;X1 Viewing Vertical   Seated Horizontal Head Turns   Symptom Description  performed seated neck tension stretches and shoulder rolls   X1 Viewing Horizontal   Foot Position seated    Time --  30 seconds, provokes 2-3/10 nausea   Reps 2   Comments provided instruction on maintaining gaze stability   X1 Viewing Vertical   Foot Position seated   Time --  15 seconds   Reps 1   Comments provokes 6/10 dizziness/nausea               PT Education - 11/21/14 1223    Education provided Yes   Education Details HEP: gaze x 1 viewing   Person(s) Educated Patient   Methods Explanation;Demonstration;Handout   Comprehension Verbalized understanding;Returned demonstration          PT Short Term Goals - 11/22/14 0748    PT SHORT TERM GOAL #1   Title The patient will return demo HEP for gaze adaptation, motion sensitivity as needed, and general high level balance/dynamic gait.    Baseline Target date 12/20/2014   Time 4   Period Weeks   PT SHORT TERM GOAL #2   Title The patient will tolerate gaze x 1 horizontal plane x 30 seconds nonstop to demo improved tolerance to movement.   Baseline Target date 12/20/2014   Time 4   Period Weeks   PT SHORT TERM GOAL #3   Title The patient will perform gaze x 1 vieiwng vertical plane x 30 seconds with dizziness < or equal to 3/10 (baseline is 6/10 with 10 reps).   Baseline Target date 12/20/2014   Time  4   Period Weeks   PT SHORT TERM GOAL #4   Title The patient will be further assessed on dynamic gait and high level balance due to reports of veering with ambulation.   Baseline Target date 12/20/2014   Time 4   Period Weeks           PT Long Term Goals - 11/22/14 0751    PT LONG TERM GOAL #1   Title The patient will return demo progression of post d/c HEP.   Baseline Target date 01/04/2015   Time 6   Period Weeks   PT LONG TERM GOAL #2   Title The patient will perform rolling in bed with dizziness 0/10 (compared to 3/10 at baseline).   Baseline Target date 01/04/2015   Time 6   Period Weeks               Plan - 11/21/14 1229    Clinical Impression Statement The patient is a 34 yo female presenting to outpatient physical therapy with dizziness that occurs independent from HA/migraines that appears worse with visual stimulation.  During evaluation today, vertical head movements tend to provoke greater symptoms than horizontal in seated position and rolling also provokes dizziness.  The patient was negative for nystagmus for positional vertigo.  PT to develop HEP and progress to improve overall movement tolerance, decrease sensitivity to visual stimuli, and further assess dynamic gait/high level balance issues.     Pt will benefit from skilled therapeutic intervention in order to improve on the following deficits Abnormal gait;Decreased activity tolerance;Decreased mobility;Dizziness;Impaired vision/preception    Rehab Potential Good   Clinical Impairments Affecting Rehab Potential h/o migraines   PT Frequency 1x / week   PT Duration 6 weeks   PT Treatment/Interventions Therapeutic exercise;Therapeutic activities;Manual techniques;ADLs/Self Care Home Management;Neuromuscular re-education;Balance training;Patient/family education;Functional mobility training;Gait training   PT Next Visit Plan check dynamic gait, check gaze, high level balance, provide further HEP.   Consulted and Agree with Plan of Care Patient         Problem List Patient Active Problem List   Diagnosis Date Noted  . Complicated migraine 78/24/2353  . Depression 07/14/2014  . Numbness 06/06/2014  . New onset headache 06/06/2014  . Dizziness 06/06/2014    Thank you for the referral of this patient. Rudell Cobb, MPT   Brewer, PT 11/22/2014, 7:57 AM  [facility] [address] [place], [place], [address] Phone: [phone]   Fax:  [phone]

## 2014-11-24 ENCOUNTER — Telehealth: Payer: Self-pay | Admitting: Neurology

## 2014-11-24 NOTE — Telephone Encounter (Signed)
Returned call. Office is currently closed, will call on Monday.

## 2014-11-24 NOTE — Telephone Encounter (Signed)
Dorian Pod from the [facility] called in regards to the referral that was sent to them/Dawn CB# [phone]

## 2014-11-27 ENCOUNTER — Telehealth: Payer: Self-pay | Admitting: Family Medicine

## 2014-11-27 NOTE — Telephone Encounter (Signed)
I returned called to [facility]. They can't see patient unless she is having hearing loss as well with her dizziness. Referral was cancelled will refer the patient to [facility].

## 2014-11-27 NOTE — Telephone Encounter (Signed)
[facility] to schedule new patient appt since [facility] couldn't see her. I was informed that the patient is already scheduled at another office. She is scheduled with [facility] on 12/11/14 @ 11:30am.

## 2014-12-06 ENCOUNTER — Ambulatory Visit
Payer: No Typology Code available for payment source | Attending: Neurology | Admitting: Rehabilitative and Restorative Service Providers"

## 2014-12-06 DIAGNOSIS — R42 Dizziness and giddiness: Secondary | ICD-10-CM | POA: Diagnosis not present

## 2014-12-06 NOTE — Patient Instructions (Addendum)
  Gaze Stabilization: Tip Card 1.Target must remain in focus, not blurry, and appear stationary while head is in motion. 2.Perform exercises with small head movements (45 to either side of midline). 3.Increase speed of head motion so long as target is in focus. 4.If you wear eyeglasses, be sure you can see target through lens (therapist will give specific instructions for bifocal / progressive lenses). 5.These exercises may provoke dizziness or nausea. Work through these symptoms. If too dizzy, slow head movement slightly. Rest between each exercise. 6.Exercises demand concentration; avoid distractions. 7.For safety, perform standing exercises close to a counter, wall, corner, or next to someone.  Copyright  VHI. All rights reserved.  Gaze Stabilization: Standing Feet Apart   Feet shoulder width apart, keeping eyes on target on wall 3 feet away, tilt head down slightly and move head side to side for 30 seconds. Repeat while moving head up and down for 30 seconds. Do 2 sessions per day.   Copyright  VHI. All rights reserved.   Feet Partial Heel-Toe, Varied Arm Positions - Eyes Closed    Stand with right foot partially in front of the other and arms out. Close eyes and visualize upright position. Hold __30__ seconds. Repeat _3___ times with each foot forward per session. Do ___2_ sessions per day.  Copyright  VHI. All rights reserved.  Healthy Back - Shoulder Roll    Stand straight with arms relaxed at sides. Roll shoulders backward continuously. Do ___10_ times. This exercise can also be done one shoulder at a time.  Copyright  VHI. All rights reserved.   Look down towards your shoulder and hold 10-20 seconds (reach towards ground with arm opposite direction you are looking)     NO  TOWEL Head turns up/down x 3 repetitions   *marching with eyes closed near a support surface.

## 2014-12-06 NOTE — Therapy (Signed)
[facility] [address] [place], [place], [address] Phone: [phone]   Fax:  [phone]  Physical Therapy Treatment  Patient Details  Name: Patricia Giles MRN: [id number] Date of Birth: 10-21-1980 Referring Provider:  Cain Saupe, MD  Encounter Date: 12/06/2014      PT End of Session - 12/06/14 1351    Visit Number 2   Number of Visits 6   Date for PT Re-Evaluation 01/04/15   Authorization Type private insurance/medcost   PT Start Time 1150   PT Stop Time 1230   PT Time Calculation (min) 40 min   Activity Tolerance Patient tolerated treatment well   Behavior During Therapy [facility] for tasks assessed/performed      Past Medical History  Diagnosis Date  . Seasonal allergies   . Asthma   . Anxiety   . Depression   . Cubital tunnel syndrome     both left and right  . Anemia   . Complication of anesthesia     Past Surgical History  Procedure Laterality Date  . Cesarean section  2012    x 1 in [place]  . Wisdom tooth extraction    . Robotic assisted laparoscopic ovarian cystectomy Left 01/16/2014    Procedure: ROBOTIC ASSISTED LAPAROSCOPIC OVARIAN CYSTECTOMY WITH PERITONEAL WASHINGS;  Surgeon: Genia Del, MD;  Location: [facility];  Service: Gynecology;  Laterality: Left;    There were no vitals filed for this visit.  Visit Diagnosis:  Dizziness and giddiness      Subjective Assessment - 12/06/14 1151    Subjective The patient reports that she is doing exercises intermittently (not every day) and notices decreased symptoms of dizziness.  She only notes dizziness with turning to the right and back to middle.   The patient reports numbness in arms and fingers has improved.  The patient reports she has ENT appt next Monday.  She has ringing in her ears continuing worse when in quiet environment.   Pertinent History Recent diagnosis of migraine with pain, numbness in arm and fingers.  She also notes  headaches occasionally.   Patient Stated Goals Reduce dizziness.   Currently in Pain? Yes   Pain Score 4    Pain Location Shoulder   Pain Orientation Left   Pain Descriptors / Indicators Aching   Pain Type Chronic pain   Pain Onset More than a month ago   Pain Frequency Intermittent   Aggravating Factors  improving   Pain Relieving Factors doing HEP from first day.             [facility] Adult PT Treatment/Exercise - 12/06/14 1209    Neuro Re-ed    Neuro Re-ed Details  Standing corner balance exercises with feet apart/eyes closed, progressing to 1/2 tandem with eyes closed,   Exercises   Exercises Neck   Neck Exercises: Seated   Cervical Rotation Right;Left  with flexion "look to shoulder" x 3 reps   Lateral Flexion Right;Left  3 reps with reaching with contralateral UE   Shoulder Rolls 10 reps   Manual Therapy   Manual Therapy Manual Traction   Manual therapy comments left parascapular soft tissue mobilization and levator/upper trap soft tissue mobilization on left side   Manual Traction gentle manual traction and overpressure P/ROM into sidebending, rotation          Vestibular Treatment/Exercise - 12/06/14 1156    Vestibular Treatment/Exercise   Vestibular Treatment Provided Habituation;Gaze   Habituation Exercises Seated Vertical Head Turns  Gaze Exercises X1 Viewing Horizontal   Seated Vertical Head Turns   Number of Reps  4   Symptom Description  4/10 symptoms   X1 Viewing Horizontal   Foot Position standing feet apart   Time --  30 seconds   Reps 2   Comments patient slows speed due to visual blurring      Gait: Dynamic gait activities with horizontal and vertical head turns, fukuda step test with R rotation >30 degrees in 30 steps, 180 degree turns without loss of balance today          PT Education - 12/06/14 1229    Education provided Yes   Education Details HEP: gaze in standing, shoulder circle, neck tension stretches, standing corner 1/2 tande  + eyes closed, marching with eyes closed   Person(s) Educated Patient   Methods Explanation;Demonstration;Handout   Comprehension Verbalized understanding;Returned demonstration          PT Short Term Goals - 12/06/14 1221    PT SHORT TERM GOAL #1   Title The patient will return demo HEP for gaze adaptation, motion sensitivity as needed, and general high level balance/dynamic gait.   Baseline Met on 12/06/2014   Time 4   Period Weeks   Status Achieved   PT SHORT TERM GOAL #2   Title The patient will tolerate gaze x 1 horizontal plane x 30 seconds nonstop to demo improved tolerance to movement.   Baseline Met on 12/06/2014   Time 4   Period Weeks   Status Achieved   PT SHORT TERM GOAL #3   Title The patient will perform gaze x 1 vieiwng vertical plane x 30 seconds with dizziness < or equal to 3/10 (baseline is 6/10 with 10 reps).   Baseline Target date 12/20/2014   Time 4   Period Weeks   PT SHORT TERM GOAL #4   Title The patient will be further assessed on dynamic gait and high level balance due to reports of veering with ambulation.   Baseline Tolerates head up/down and side/side without veering during gait   Time 4   Period Weeks   Status Achieved           PT Long Term Goals - 11/22/14 0751    PT LONG TERM GOAL #1   Title The patient will return demo progression of post d/c HEP.   Baseline Target date 01/04/2015   Time 6   Period Weeks   PT LONG TERM GOAL #2   Title The patient will perform rolling in bed with dizziness 0/10 (compared to 3/10 at baseline).   Baseline Target date 01/04/2015   Time 6   Period Weeks               Plan - 12/06/14 1229    Clinical Impression Statement Patient's headache is aggravated by exercises in PT today.  She is having less frequent episodes of dizziness and tolerates more movement at today's session.  PT progressed HEP and recommended patient perform to her tolerance.   PT Next Visit Plan check HEP, progress habituation  and gaze activities.   Consulted and Agree with Plan of Care Patient        Problem List Patient Active Problem List   Diagnosis Date Noted  . Complicated migraine 16/12/9602  . Depression 07/14/2014  . Numbness 06/06/2014  . New onset headache 06/06/2014  . Dizziness 06/06/2014    Doha Boling, PT 12/06/2014, 1:54 PM  [place] [address]  [place], [place], [address] Phone: [phone]   Fax:  [phone]

## 2014-12-07 ENCOUNTER — Other Ambulatory Visit: Payer: Self-pay | Admitting: Family Medicine

## 2014-12-07 DIAGNOSIS — F32A Depression, unspecified: Secondary | ICD-10-CM

## 2014-12-07 DIAGNOSIS — F329 Major depressive disorder, single episode, unspecified: Secondary | ICD-10-CM

## 2014-12-07 MED ORDER — BUPROPION HCL ER (XL) 300 MG PO TB24: 300.0000 mg | ORAL_TABLET | Freq: Every day | ORAL | Status: DC

## 2014-12-07 NOTE — Telephone Encounter (Signed)
Received fax rf request form [facility]

## 2014-12-19 ENCOUNTER — Ambulatory Visit: Payer: No Typology Code available for payment source | Admitting: Rehabilitative and Restorative Service Providers"

## 2015-01-03 ENCOUNTER — Encounter: Payer: No Typology Code available for payment source | Admitting: Rehabilitative and Restorative Service Providers"

## 2015-01-30 ENCOUNTER — Ambulatory Visit (INDEPENDENT_AMBULATORY_CARE_PROVIDER_SITE_OTHER): Payer: No Typology Code available for payment source | Admitting: Neurology

## 2015-01-30 ENCOUNTER — Encounter: Payer: Self-pay | Admitting: Neurology

## 2015-01-30 VITALS — BP 122/84 | HR 103 | Ht 70.0 in | Wt 339.0 lb

## 2015-01-30 DIAGNOSIS — H9190 Unspecified hearing loss, unspecified ear: Secondary | ICD-10-CM | POA: Insufficient documentation

## 2015-01-30 DIAGNOSIS — R42 Dizziness and giddiness: Secondary | ICD-10-CM

## 2015-01-30 DIAGNOSIS — G43909 Migraine, unspecified, not intractable, without status migrainosus: Secondary | ICD-10-CM

## 2015-01-30 DIAGNOSIS — H9192 Unspecified hearing loss, left ear: Secondary | ICD-10-CM

## 2015-01-30 DIAGNOSIS — G43109 Migraine with aura, not intractable, without status migrainosus: Secondary | ICD-10-CM

## 2015-01-30 MED ORDER — TOPIRAMATE 25 MG PO TABS: ORAL_TABLET | ORAL | Status: DC

## 2015-01-30 NOTE — Progress Notes (Signed)
NEUROLOGY FOLLOW UP OFFICE NOTE  Patricia Giles ZI:[id number]  HISTORY OF PRESENT ILLNESS: I had the pleasure of seeing Patricia Giles in follow-up in the neurology clinic on 01/30/2015.  The patient was last seen 3 months ago for numbness, dizziness, and headaches. She had initial good response to Bupropion XL with less headaches and dizziness, but presents today reporting worsening migraines. She has migraines 1-2 times a week, with pain over the left side of her head, then having left chest pain going down her left arm. Headaches last 30-60 minutes with associated photophobia. No nausea, but yesterday she felt weird with her migraine, vision was blurred and her mouth felt watery. She reports dizziness occurs with the migraines. She underwent vestibular therapy which helped a little. She cannot function at work with the Imitrex, and takes Ibuprofen 800mg  1-2 times a week with better response. Last menstrual period was 3 weeks ago. She continues to have left ear pain and decreased hearing. She saw ENT and had a hearing test, told that they can tell there is hearing loss but that her eardrum looks fine. She was given 2 weeks of Prednisone which did not help, and tells me that she was told "there could be a tumor." Her MRI brain without contrast done 06/2014 was normal.   HPI: This is a pleasant 34 yo RH woman with no significant past medical history who presented with several neurological symptoms including numbness, dizziness, and headaches. She reports episodic numbness occurring on and off since high school from her left shoulder down to her left foot. This would last for a few minutes, initially occurring infrequently, but now more regular around once every other month. Sometimes she feels she cannot speak, she has a thought and tries to respond but "the words are there but just not coming out." Last episode was in January 2016. She can be walking then her left leg goes numb and she loses balance. She has  been told she would shift to the left side when walking, and has had to hold on to the wall. She feels the left forearm is constantly "like sandpaper" when she touches it. No facial symptoms. She occasionally has noticed difficulty gripping things with her left hand separate from the numbness episodes. Around a year ago, she started having dizzy episodes where she feels like she is floating. This can occur while sitting or standing, does not occur when lying down. Twice she had to sit down because the room felt like it was moving for 10-15 minutes. When more intense, she has some nausea. Minor episodes last only a few minutes. These occur a couple of times a month, last episode was a week ago. In February 2016, she started having headaches over the right temporal region, described as unbearable stabbing pain. She has noticed her vision was affected, she could see clearly on the upper fields, but felt like the bottom visual field was shaking. She took pain medication and vision improved, her headache became more bearable, then came back worse the next day. It took 2 days to fully resolve. She has had smaller ones since then. She was started on Wellbutrin due to concern that headaches were due to depression. She has been having mild headaches daily, 2/10 in intensity, lasting 30-60 minutes. She has not taken any medication for these. No further vision changes, no nausea/vomiting. She denies any significant history of headaches except at age 58 or 84 she had 1 migraine and recalls being given a cocktail in  the ER. She has intermittent tinnitus. She has noticed occasionally memory gaps since her late 50s, she would be driving and end up somewhere else, unsure how she got there. This has not affected her work, she just makes a lot of lists. She denies any staring/unresponsive episodes. She denies any myoclonic jerks. She takes Ativan prn for anxiety a few times a year. She was started on birth control pills for PCOS.  There is no family history of migraines. She had a normal birth and early development, no history of CNS infections, febrile convulsions, significant traumatic brain injury, family history of seizures.  Records and images were personally reviewed where available. I personally reviewed MRI brain with and without contrast which was normal. Her routine EEG and B12 level were normal. She reports that the left-sided numbness has resolved. She continues to have headaches a couple of times a week. This may be associated with nausea. She took Ibuprofen 400mg  which made the pain tolerable, lasting for several hours. She had 2 dizzy spells yesterday, these occur at least once a week, independent of the headaches. They can occur when sitting or standing. Yesterday she felt like she was spinning. She was started on Bupropion 150mg  BID which has helped make her mood more stable, but this makes her drowsy.   PAST MEDICAL HISTORY: Past Medical History  Diagnosis Date  . Seasonal allergies   . Asthma   . Anxiety   . Depression   . Cubital tunnel syndrome     both left and right  . Anemia   . Complication of anesthesia     MEDICATIONS: Current Outpatient Prescriptions on File Prior to Visit  Medication Sig Dispense Refill  . albuterol (PROVENTIL HFA;VENTOLIN HFA) 108 (90 BASE) MCG/ACT inhaler Inhale 1 puff into the lungs every 6 (six) hours as needed for wheezing or shortness of breath.    Marland Kitchen buPROPion (WELLBUTRIN XL) 300 MG 24 hr tablet Take 1 tablet (300 mg total) by mouth daily. Called into [facility] [phone] 30 tablet 6  . CAMILA 0.35 MG tablet Take 1 tablet by mouth daily. Daily  2  . cetirizine (ZYRTEC) 10 MG tablet Take 10 mg by mouth daily.    . fluticasone (FLONASE) 50 MCG/ACT nasal spray Place 1 spray into both nostrils every morning.    . Fluticasone Furoate-Vilanterol (BREO ELLIPTA IN) Inhale into the lungs daily.     Marland Kitchen ibuprofen (ADVIL,MOTRIN) 800 MG tablet Take 1 tablet  (800 mg total) by mouth 3 (three) times daily. 21 tablet 0  . levocetirizine (XYZAL) 5 MG tablet Take 5 mg by mouth every evening.    Marland Kitchen LORazepam (ATIVAN) 1 MG tablet Take 1 mg by mouth daily as needed for anxiety.    . montelukast (SINGULAIR) 10 MG tablet Take 10 mg by mouth at bedtime.    . SUMAtriptan (IMITREX) 25 MG tablet Take 1 tablet at onset of headache, may take second dose after 2 hours if needed. Do not take more than 3 a week (Patient not taking: Reported on 10/30/2014) 10 tablet 4   No current facility-administered medications on file prior to visit.    ALLERGIES: Allergies  Allergen Reactions  . Bismuth-Containing Compounds Hives  . Adhesive [Tape] Rash    FAMILY HISTORY: No family history on file.  SOCIAL HISTORY: Social History   Social History  . Marital Status: Married    Spouse Name: N/A  . Number of Children: 1  . Years of Education: N/A  Occupational History  . Mental Health    Social History Main Topics  . Smoking status: Never Smoker   . Smokeless tobacco: Never Used  . Alcohol Use: 0.0 oz/week    0 Standard drinks or equivalent per week     Comment: socially  . Drug Use: No  . Sexual Activity: Yes    Birth Control/ Protection: None   Other Topics Concern  . Not on file   Social History Narrative    REVIEW OF SYSTEMS: Constitutional: No fevers, chills, or sweats, no generalized fatigue, change in appetite Eyes: No visual changes, double vision, eye pain Ear, nose and throat: No hearing loss, ear pain, nasal congestion, sore throat Cardiovascular: No chest pain, palpitations Respiratory:  No shortness of breath at rest or with exertion, wheezes GastrointestinaI: No nausea, vomiting, diarrhea, abdominal pain, fecal incontinence Genitourinary:  No dysuria, urinary retention or frequency Musculoskeletal:  No neck pain, back pain Integumentary: No rash, pruritus, skin lesions Neurological: as above Psychiatric: No depression, insomnia,  anxiety Endocrine: No palpitations, fatigue, diaphoresis, mood swings, change in appetite, change in weight, increased thirst Hematologic/Lymphatic:  No anemia, purpura, petechiae. Allergic/Immunologic: no itchy/runny eyes, nasal congestion, recent allergic reactions, rashes  PHYSICAL EXAM: Filed Vitals:   01/30/15 1254  BP: 122/84  Pulse: 103   General: No acute distress Head:  Normocephalic/atraumatic Neck: supple, no paraspinal tenderness, full range of motion Heart:  Regular rate and rhythm Lungs:  Clear to auscultation bilaterally Back: No paraspinal tenderness Skin/Extremities: No rash, no edema Neurological Exam: alert and oriented to person, place, and time. No aphasia or dysarthria. Fund of knowledge is appropriate.  Recent and remote memory are intact.  Attention and concentration are normal.    Able to name objects and repeat phrases. Cranial nerves: Pupils equal, round, reactive to light.  Fundoscopic exam unremarkable, no papilledema. Extraocular movements intact with no nystagmus. Visual fields full. Facial sensation intact. No facial asymmetry. Tongue, uvula, palate midline.  Motor: Bulk and tone normal, muscle strength 5/5 throughout with no pronator drift.  Sensation to light touch intact.  No extinction to double simultaneous stimulation.  Deep tendon reflexes 2+ throughout, toes downgoing.  Finger to nose testing intact.  Gait narrow-based and steady, able to tandem walk adequately.  Romberg negative.  IMPRESSION: This is a pleasant 34 yo RH woman who presented with several neurological symptoms including recurrent episodes of left-sided numbness, new onset headaches, and dizziness. MRI brain normal. She had initial good response to Bupropion with some reduction in headaches and dizziness, but presents today with increase in migraines. Headaches with some migrainous features, as well as dizziness. Consideration for complicated migraines/vertiginous migraines. She will start  Topamax 25mg  qhs x 2 weeks, then increase to 50mg  qhs for headache prophylaxis. Side effects, including effects on unborn fetus, were discussed, she has no pregnancy plans and is on birth control. She continues to have left ear pain and hearing loss, and tells me that she has been told by ENT that they don't know what to do. She will be referred to [facility]NT for second opinion. She was given samples of Relpax and Zomig nasal spray to take at onset of migraine, and will let us know which is helpful so we can send refills. She did not tolerate Imitrex. She will keep a calendar of her headaches and knows to minimize any rescue medication to 2-3 a week to avoid rebound headaches. She will follow-up in 2 moonths and knows to call our office for any changes.  Thank you for allowing me to participate in her care.  Please do not hesitate to call for any questions or concerns.  The duration of this appointment visit was 25 minutes of face-to-face time with the patient.  Greater than 50% of this time was spent in counseling, explanation of diagnosis, planning of further management, and coordination of care.   Ellouise Newer, M.D.   CC: Dr. Chapman Fitch

## 2015-01-30 NOTE — Patient Instructions (Signed)
1. Start Topamax 25mg : Take 1 tablet at night for 2 weeks, then increase to 2 tablets at night 2. Try Relpax and Zomig samples at onset of migraine, let us know which is more helpful and we will send in refills. 3. Refer to [facility]NT for second opinion on left-sided hearing loss 4. Do not take more than 2-3 Advil a week to avoid rebound headaches 5. Follow-up in 2 months

## 2015-01-31 ENCOUNTER — Telehealth: Payer: Self-pay | Admitting: Family Medicine

## 2015-01-31 NOTE — Telephone Encounter (Signed)
Called patient to let her know that she has been scheduled with [facility] to see Patricia Giles on 02/13/15 @ 1:30 pm with a 1:15 pm arrival. They will mail her a new patient packet.   Records faxed  Ph: [phone] Fax: [phone]

## 2015-03-19 ENCOUNTER — Encounter: Payer: Self-pay | Admitting: Rehabilitative and Restorative Service Providers"

## 2015-03-19 NOTE — Therapy (Signed)
[place] [address] [place], [place], [address] Phone: [phone]   Fax:  [phone]  Patient Details  Name: Patricia Giles MRN: [id number] Date of Birth: [birth year] Referring Provider:  No ref. provider found  Encounter Date: last encounter 12/06/2014  PHYSICAL THERAPY DISCHARGE SUMMARY  Visits from Start of Care: 2  Current functional level related to goals / functional outcomes:     PT Short Term Goals - 12/06/14 1221    PT SHORT TERM GOAL #1   Title The patient will return demo HEP for gaze adaptation, motion sensitivity as needed, and general high level balance/dynamic gait.   Baseline Met on 12/06/2014   Time 4   Period Weeks   Status Achieved   PT SHORT TERM GOAL #2   Title The patient will tolerate gaze x 1 horizontal plane x 30 seconds nonstop to demo improved tolerance to movement.   Baseline Met on 12/06/2014   Time 4   Period Weeks   Status Achieved   PT SHORT TERM GOAL #3   Title The patient will perform gaze x 1 vieiwng vertical plane x 30 seconds with dizziness < or equal to 3/10 (baseline is 6/10 with 10 reps).   Baseline Target date 12/20/2014   Time 4   Period Weeks   PT SHORT TERM GOAL #4   Title The patient will be further assessed on dynamic gait and high level balance due to reports of veering with ambulation.   Baseline Tolerates head up/down and side/side without veering during gait   Time 4   Period Weeks   Status Achieved         PT Long Term Goals - 11/22/14 0751    PT LONG TERM GOAL #1   Title The patient will return demo progression of post d/c HEP.   Baseline Target date 01/04/2015   Time 6   Period Weeks   PT LONG TERM GOAL #2   Title The patient will perform rolling in bed with dizziness 0/10 (compared to 3/10 at baseline).   Baseline Target date 01/04/2015   Time 6   Period Weeks     *goals not reassessed as patient did not return for further PT visits.    Remaining deficits: See eval and treatment notes from 9/20 and 10/5 for patient status-did not return.   Education / Equipment: HEP.  Plan: Patient agrees to discharge.  Patient goals were partially met. Patient is being discharged due to not returning since the last visit.  ?????       Thank you for the referral of this patient. Rudell Cobb, MPT    [place] 03/19/2015, 10:50 AM  [facility] [address] [place], [place], [address] Phone: [phone]   Fax:  [phone]

## 2015-04-10 ENCOUNTER — Emergency Department (HOSPITAL_COMMUNITY): Payer: No Typology Code available for payment source

## 2015-04-10 ENCOUNTER — Emergency Department (HOSPITAL_COMMUNITY)
Admission: EM | Admit: 2015-04-10 | Discharge: 2015-04-10 | Disposition: A | Discharge status: Home / Self Care | Payer: No Typology Code available for payment source | Attending: Emergency Medicine | Admitting: Emergency Medicine

## 2015-04-10 ENCOUNTER — Encounter (HOSPITAL_COMMUNITY): Payer: Self-pay | Admitting: Cardiology

## 2015-04-10 DIAGNOSIS — Z862 Personal history of diseases of the blood and blood-forming organs and certain disorders involving the immune mechanism: Secondary | ICD-10-CM | POA: Diagnosis not present

## 2015-04-10 DIAGNOSIS — Z79899 Other long term (current) drug therapy: Secondary | ICD-10-CM | POA: Diagnosis not present

## 2015-04-10 DIAGNOSIS — F419 Anxiety disorder, unspecified: Secondary | ICD-10-CM | POA: Insufficient documentation

## 2015-04-10 DIAGNOSIS — R079 Chest pain, unspecified: Secondary | ICD-10-CM | POA: Diagnosis present

## 2015-04-10 DIAGNOSIS — J45909 Unspecified asthma, uncomplicated: Secondary | ICD-10-CM | POA: Diagnosis not present

## 2015-04-10 DIAGNOSIS — Z8669 Personal history of other diseases of the nervous system and sense organs: Secondary | ICD-10-CM | POA: Insufficient documentation

## 2015-04-10 DIAGNOSIS — F329 Major depressive disorder, single episode, unspecified: Secondary | ICD-10-CM | POA: Diagnosis not present

## 2015-04-10 DIAGNOSIS — Z791 Long term (current) use of non-steroidal anti-inflammatories (NSAID): Secondary | ICD-10-CM | POA: Diagnosis not present

## 2015-04-10 DIAGNOSIS — Z7951 Long term (current) use of inhaled steroids: Secondary | ICD-10-CM | POA: Diagnosis not present

## 2015-04-10 LAB — BASIC METABOLIC PANEL
Anion gap: 9 (ref 5–15)
BUN: 5 mg/dL — AB (ref 6–20)
CHLORIDE: 106 mmol/L (ref 101–111)
CO2: 25 mmol/L (ref 22–32)
CREATININE: 0.7 mg/dL (ref 0.44–1.00)
Calcium: 8.8 mg/dL — ABNORMAL LOW (ref 8.9–10.3)
GFR calc Af Amer: >60 mL/min (ref >60)
GFR calc non Af Amer: >60 mL/min (ref >60)
GLUCOSE: 102 mg/dL — AB (ref 65–99)
Potassium: 3.7 mmol/L (ref 3.5–5.1)
Sodium: 140 mmol/L (ref 135–145)

## 2015-04-10 LAB — CBC
HCT: 36.4 % (ref 36.0–46.0)
Hemoglobin: 12 g/dL (ref 12.0–15.0)
MCH: 27.5 pg (ref 26.0–34.0)
MCHC: 33 g/dL (ref 30.0–36.0)
MCV: 83.5 fL (ref 78.0–100.0)
PLATELETS: 277 10*3/uL (ref 150–400)
RBC: 4.36 MIL/uL (ref 3.87–5.11)
RDW: 14.9 % (ref 11.5–15.5)
WBC: 5.4 10*3/uL (ref 4.0–10.5)

## 2015-04-10 LAB — I-STAT TROPONIN, ED
Troponin i, poc: 0 ng/mL (ref 0.00–0.08)
Troponin i, poc: 0 ng/mL (ref 0.00–0.08)

## 2015-04-10 MED ORDER — NITROGLYCERIN 2 % TD OINT
0.5000 [in_us] | TOPICAL_OINTMENT | Freq: Once | TRANSDERMAL | Status: AC
  Administered 2015-04-10: 0.5 [in_us] via TOPICAL
  Filled 2015-04-10: qty 1

## 2015-04-10 NOTE — Discharge Instructions (Signed)
Please read and follow all provided instructions.  Your diagnoses today include:  1. Chest pain, unspecified chest pain type    Tests performed today include:  An EKG of your heart  A chest x-ray  Cardiac enzymes - a blood test for heart muscle damage  Blood counts and electrolytes  Vital signs. See below for your results today.   Medications prescribed:   Take any prescribed medications only as directed.  Follow-up instructions: Please follow-up and make ann appointment with a cardiologist provided    Return instructions:  SEEK IMMEDIATE MEDICAL ATTENTION IF:  You have severe chest pain, especially if the pain is crushing or pressure-like and spreads to the arms, back, neck, or jaw, or if you have sweating, nausea (feeling sick to your stomach), or shortness of breath. THIS IS AN EMERGENCY. Don't wait to see if the pain will go away. Get medical help at once. Call 911 or 0 (operator). DO NOT drive yourself to the hospital.   Your chest pain gets worse and does not go away with rest.   You have an attack of chest pain lasting longer than usual, despite rest and treatment with the medications your caregiver has prescribed.   You wake from sleep with chest pain or shortness of breath.  You feel dizzy or faint.  You have chest pain not typical of your usual pain for which you originally saw your caregiver.   You have any other emergent concerns regarding your health.  Additional Information: Chest pain comes from many different causes. Your caregiver has diagnosed you as having chest pain that is not specific for one problem, but does not require admission.  You are at low risk for an acute heart condition or other serious illness.   Your vital signs today were: BP 136/88 mmHg   Pulse 86   Temp(Src) 97.5 F (36.4 C) (Oral)   Resp 17   Ht 5\' 10"  (1.778 m)   Wt 152.862 kg   BMI 48.35 kg/m2   SpO2 99%   LMP 04/10/2015 If your blood pressure (BP) was elevated above 135/85 this  visit, please have this repeated by your doctor within one month. --------------

## 2015-04-10 NOTE — ED Notes (Signed)
Nitro paste removed from pt prior to discharge.

## 2015-04-10 NOTE — ED Notes (Signed)
Pt in radiology 

## 2015-04-10 NOTE — Progress Notes (Signed)
Electrophysiology Office Note   Date:  04/11/2015   ID:  Patricia Giles, DOB 04-Oct-1980, MRN [id number]  PCP:  Antony Blackbird, MD  Cardiologist:   Constance Haw, MD    Chief Complaint  Patient presents with  . New Patient (Initial Visit)     History of Present Illness: Patricia Giles is a 35 y.o. female who presents today for cardiology evaluation.   She presented in the emergency room on 04/10/15 with chest pain. The pain began the morning of presentation at 9 AM. It was in the center for chest and radiated to her left arm. It was a sharp 10 out of 10 pain and was intermittent. It lasted 30 seconds at a time. She has no history of diabetes, hypertension, hyperlipidemia, tobacco. No family history of coronary disease. Troponin 2 in the emergency room was negative. Occurred when she was at rest and lasted up to 30 seconds. She said that it was a sharp pain in the center of her chest that radiated to her left shoulder. After she had this pain she had episodes of 1-2 second bursts of sharp chest pain. The pain was not associated with exertion and did not improve with rest.  Today, she denies symptoms of palpitations, chest pain, shortness of breath, orthopnea, PND, lower extremity edema, claudication, dizziness, presyncope, syncope, bleeding, or neurologic sequela. The patient is tolerating medications without difficulties and is otherwise without complaint today.    Past Medical History  Diagnosis Date  . Seasonal allergies   . Asthma   . Anxiety   . Depression   . Cubital tunnel syndrome     both left and right  . Anemia   . Complication of anesthesia    Past Surgical History  Procedure Laterality Date  . Cesarean section  2012    x 1 in [place]  . Wisdom tooth extraction    . Robotic assisted laparoscopic ovarian cystectomy Left 01/16/2014    Procedure: ROBOTIC ASSISTED LAPAROSCOPIC OVARIAN CYSTECTOMY WITH PERITONEAL WASHINGS;  Surgeon: [name], MD;  Location: [facility];  Service: Gynecology;  Laterality: Left;     Current Outpatient Prescriptions  Medication Sig Dispense Refill  . albuterol (PROVENTIL HFA;VENTOLIN HFA) 108 (90 BASE) MCG/ACT inhaler Inhale 1 puff into the lungs every 6 (six) hours as needed for wheezing or shortness of breath.    Marland Kitchen BREO ELLIPTA 200-25 MCG/INH AEPB Inhale 1 puff into the lungs daily.  6  . buPROPion (WELLBUTRIN XL) 300 MG 24 hr tablet Take 1 tablet (300 mg total) by mouth daily. Called into [facility] [phone] 30 tablet 6  . cetirizine (ZYRTEC) 10 MG tablet Take 10 mg by mouth daily.    . cholecalciferol (VITAMIN D) 1000 units tablet Take 1,000 Units by mouth daily.    . fluticasone (FLONASE) 50 MCG/ACT nasal spray Place 1 spray into both nostrils daily as needed for allergies.     Marland Kitchen ibuprofen (ADVIL,MOTRIN) 800 MG tablet Take 1 tablet (800 mg total) by mouth 3 (three) times daily. 21 tablet 0  . levocetirizine (XYZAL) 5 MG tablet Take 5 mg by mouth every evening.    Marland Kitchen LORazepam (ATIVAN) 1 MG tablet Take 1 mg by mouth daily as needed for anxiety.    . montelukast (SINGULAIR) 10 MG tablet Take 10 mg by mouth at bedtime.    . vitamin B-12 (CYANOCOBALAMIN) 100 MCG tablet Take 100 mcg by mouth daily.     No current facility-administered medications for this visit.  Allergies:   Bismuth-containing compounds; Other; and Adhesive   Social History:  The patient  reports that she has never smoked. She has never used smokeless tobacco. She reports that she drinks alcohol. She reports that she does not use illicit drugs.   Family History:  The patient's family history includes Cancer - Ovarian in her maternal grandmother; Diverticulitis in her father and mother; Berenice Primas' disease in her sister; Heart attack in her maternal grandfather; Hyperlipidemia in her maternal grandfather; Stroke in her maternal grandfather.    ROS:  Please see the history of present illness.   Otherwise, review of systems is positive  for chest pain, leg pain, DOE, headaches, dizziness.   All other systems are reviewed and negative.    PHYSICAL EXAM: VS:  BP 122/82 mmHg  Pulse 68  Ht 5\' 10"  (1.778 m)  Wt 326 lb (147.873 kg)  BMI 46.78 kg/m2  LMP 04/10/2015 , BMI Body mass index is 46.78 kg/(m^2). GEN: Well nourished, well developed, in no acute distress HEENT: normal Neck: no JVD, carotid bruits, or masses Cardiac: RRR; no murmurs, rubs, or gallops,no edema  Respiratory:  clear to auscultation bilaterally, normal work of breathing GI: soft, nontender, nondistended, + BS MS: no deformity or atrophy Skin: warm and dry Neuro:  Strength and sensation are intact Psych: euthymic mood, full affect  EKG:  EKG is not ordered today.  Recent Labs: 04/10/2015: BUN 5*; Creatinine, Ser 0.70; Hemoglobin 12.0; Platelets 277; Potassium 3.7; Sodium 140    Lipid Panel  No results found for: CHOL, TRIG, HDL, CHOLHDL, VLDL, LDLCALC, LDLDIRECT   Wt Readings from Last 3 Encounters:  04/11/15 326 lb (147.873 kg)  04/10/15 337 lb (152.862 kg)  01/30/15 339 lb (153.769 kg)     ASSESSMENT AND PLAN:  1.  Chest pain: She is having chest pain which occurs at rest, and is sharp lasting a few seconds. She went to the emergency room and had an EKG which showed no signs of ischemia, and negative troponins 2. Her pain is very atypical, as it was not associated with exertion and not improved with rest. At this time it is doubtful that her chest pain is cardiac in nature. I told her that if she continues to have discomfort, but she can return to clinic for further testing.    Current medicines are reviewed at length with the patient today.   The patient does not have concerns regarding her medicines.  The following changes were made today:  none  Labs/ tests ordered today include:  No orders of the defined types were placed in this encounter.     Disposition:   FU PRN  Signed, Will Meredith Leeds, MD  04/11/2015 9:52 AM     [facility] [address] [place] [address] [phone] (office) [phone] (fax)

## 2015-04-10 NOTE — ED Notes (Signed)
Pt reports central chest pain that started about 9am this morning. Pain was sharp in nature, and states she had one episode of vomiting last night.

## 2015-04-10 NOTE — ED Provider Notes (Signed)
CSN: [id number]     Arrival date & time 04/10/15  1023 History   First MD Initiated Contact with Patient 04/10/15 1151     Chief Complaint  Patient presents with  . Chest Pain   (Consider location/radiation/quality/duration/timing/severity/associated sxs/prior Treatment) HPI 35 y.o. female  presents to the Emergency Department today complaining of chest pain that began this morning around 9am. States that the pain was in the center of her chest with radiation to the left arm. Pain was sharp 10/10 and intermittent. Pain lasts in bouts of 30 seconds. Now is 6/10. No previous hx of MI in the past. Noted an elevated troponin once in the ED x1 year ago. She was [facility] with no follow up to Cardiology. No hx of DM, HTN, HLD. Does not smoke. No FH MI. No N/V/D. No SOB/ABD pain. No headache. No fevers. No vision changes. No other symptoms noted.     Past Medical History  Diagnosis Date  . Seasonal allergies   . Asthma   . Anxiety   . Depression   . Cubital tunnel syndrome     both left and right  . Anemia   . Complication of anesthesia    Past Surgical History  Procedure Laterality Date  . Cesarean section  2012    x 1 in [place]  . Wisdom tooth extraction    . Robotic assisted laparoscopic ovarian cystectomy Left 01/16/2014    Procedure: ROBOTIC ASSISTED LAPAROSCOPIC OVARIAN CYSTECTOMY WITH PERITONEAL WASHINGS;  Surgeon: [name], MD;  Location: [facility];  Service: Gynecology;  Laterality: Left;   History reviewed. No pertinent family history. Social History  Substance Use Topics  . Smoking status: Never Smoker   . Smokeless tobacco: Never Used  . Alcohol Use: 0.0 oz/week    0 Standard drinks or equivalent per week     Comment: socially   OB History    No data available     Review of Systems ROS reviewed and all are negative for acute change except as noted in the HPI.  Allergies  Bismuth-containing compounds and Adhesive  Home Medications   Prior to Admission  medications   Medication Sig Start Date End Date Taking? Authorizing Provider  albuterol (PROVENTIL HFA;VENTOLIN HFA) 108 (90 BASE) MCG/ACT inhaler Inhale 1 puff into the lungs every 6 (six) hours as needed for wheezing or shortness of breath.    Historical Provider, MD  buPROPion (WELLBUTRIN XL) 300 MG 24 hr tablet Take 1 tablet (300 mg total) by mouth daily. Called into [facility] 12/07/14   Cameron Sprang, MD  CAMILA 0.35 MG tablet Take 1 tablet by mouth daily. Daily 05/11/14   Historical Provider, MD  cetirizine (ZYRTEC) 10 MG tablet Take 10 mg by mouth daily.    Historical Provider, MD  fluticasone (FLONASE) 50 MCG/ACT nasal spray Place 1 spray into both nostrils every morning.    Historical Provider, MD  Fluticasone Furoate-Vilanterol (BREO ELLIPTA IN) Inhale into the lungs daily.     Historical Provider, MD  ibuprofen (ADVIL,MOTRIN) 800 MG tablet Take 1 tablet (800 mg total) by mouth 3 (three) times daily. 05/08/14   April Palumbo, MD  levocetirizine (XYZAL) 5 MG tablet Take 5 mg by mouth every evening.    Historical Provider, MD  LORazepam (ATIVAN) 1 MG tablet Take 1 mg by mouth daily as needed for anxiety.    Historical Provider, MD  montelukast (SINGULAIR) 10 MG tablet Take 10 mg by mouth at bedtime.  Historical Provider, MD  SUMAtriptan (IMITREX) 25 MG tablet Take 1 tablet at onset of headache, may take second dose after 2 hours if needed. Do not take more than 3 a week Patient not taking: Reported on 10/30/2014 07/07/14   Cameron Sprang, MD  topiramate (TOPAMAX) 25 MG tablet Take 1 tablet at night for 2 weeks, then increase to 2 tablets at night 01/30/15   Cameron Sprang, MD   BP 134/100 mmHg  Pulse 87  Temp(Src) 97.5 F (36.4 C) (Oral)  Resp 18  Ht 5\' 10"  (1.778 m)  Wt 152.862 kg  BMI 48.35 kg/m2  SpO2 99%  LMP 04/10/2015   Physical Exam  Constitutional: She is oriented to person, place, and time. She appears well-developed and well-nourished.  HENT:   Head: Normocephalic and atraumatic.  Eyes: EOM are normal.  Neck: Normal range of motion. Neck supple.  Cardiovascular: Normal rate, regular rhythm and normal heart sounds.   Pulmonary/Chest: Effort normal and breath sounds normal. She has no wheezes. She has no rales. She exhibits no tenderness.  Abdominal: Soft. There is no tenderness.  Musculoskeletal: Normal range of motion.  Neurological: She is alert and oriented to person, place, and time.  Skin: Skin is warm and dry.  Psychiatric: She has a normal mood and affect. Her behavior is normal. Thought content normal.  Nursing note and vitals reviewed.   ED Course  Procedures (including critical care time) Labs Review Labs Reviewed  BASIC METABOLIC PANEL - Abnormal; Notable for the following:    Glucose, Bld 102 (*)    BUN 5 (*)    Calcium 8.8 (*)    All other components within normal limits  CBC  I-STAT TROPOININ, ED  I-STAT TROPOININ, ED   Imaging Review Dg Chest 2 View  04/10/2015  CLINICAL DATA:  Mid chest pain that radiates to left shoulder starting this morning - pain is sharp at first and becomes a more constant dull pain, having some SOB as well today - hx of asthma, nonsmoker, no known heart issues EXAM: CHEST  2 VIEW COMPARISON:  08/30/2014 FINDINGS: The heart size and mediastinal contours are within normal limits. Both lungs are clear. No pleural effusion or pneumothorax. The visualized skeletal structures are unremarkable. IMPRESSION: No active cardiopulmonary disease. Electronically Signed   By: Lajean Manes M.D.   On: 04/10/2015 11:52   I have personally reviewed and evaluated these images and lab results as part of my medical decision-making.   EKG Interpretation   Date/Time:  Tuesday April 10 2015 10:33:38 EST Ventricular Rate:  102 PR Interval:  188 QRS Duration: 78 QT Interval:  352 QTC Calculation: 458 R Axis:   55 Text Interpretation:  Sinus tachycardia Otherwise normal ECG No old  tracing to  compare Confirmed by [facility]  MD, MARTHA [phone]) on 04/10/2015  11:42:26 AM      MDM  I have reviewed relevant laboratory values. I have reviewed relevant imaging studies. I personally interpreted the relevant EKG. I have reviewed the relevant previous healthcare records. I obtained HPI from historian. Patient discussed with supervising physician  ED Course:  Assessment: 61y F is to be discharged with recommendation to follow up with PCP in regards to today's hospital visit. Chest pain is not likely of cardiac or pulmonary etiology d/t presentation, perc negative, Heart Score 2. VSS, no tracheal deviation, no JVD or new murmur, RRR, breath sounds equal bilaterally, EKG without acute abnormalities, negative troponin x2, and negative CXR. Pt has been advised  start a PPI and return to the ED is CP becomes exertional, associated with diaphoresis or nausea, radiates to left jaw/arm, worsens or becomes concerning in any way. Pt appears reliable for follow up and is agreeable to discharge. Patient is in no acute distress. Vital Signs are stable. Patient is able to ambulate. Patient able to tolerate PO.   Disposition/Plan:  DC Home Additional Verbal discharge instructions given and discussed with patient.  Pt Instructed to f/u with PCP in the next 48 hours for evaluation and treatment of symptoms.  Return precautions given Pt acknowledges and agrees with plan  Supervising Physician Alfonzo Beers, MD   Final diagnoses:  Chest pain, unspecified chest pain type       Shary Decamp, PA-C 04/10/15 2154  Alfonzo Beers, MD 04/13/15 [phone]

## 2015-04-11 ENCOUNTER — Ambulatory Visit (INDEPENDENT_AMBULATORY_CARE_PROVIDER_SITE_OTHER): Payer: PRIVATE HEALTH INSURANCE | Admitting: Cardiology

## 2015-04-11 ENCOUNTER — Encounter: Payer: Self-pay | Admitting: Cardiology

## 2015-04-11 VITALS — BP 122/82 | HR 68 | Ht 70.0 in | Wt 326.0 lb

## 2015-04-11 DIAGNOSIS — R079 Chest pain, unspecified: Secondary | ICD-10-CM

## 2015-04-11 NOTE — Patient Instructions (Addendum)
Medication Instructions:  Your physician recommends that you continue on your current medications as directed. Please refer to the Current Medication list given to you today.  Labwork: None ordered  Testing/Procedures: None ordered  Follow-Up: No follow up is needed at this time with cardiology.  [facility] will see you on an as needed basis.  Thank you for choosing [facility]!!   Trinidad Curet, RN [phone]

## 2015-05-02 ENCOUNTER — Ambulatory Visit: Payer: No Typology Code available for payment source | Admitting: Neurology

## 2015-05-14 ENCOUNTER — Ambulatory Visit (INDEPENDENT_AMBULATORY_CARE_PROVIDER_SITE_OTHER): Payer: PRIVATE HEALTH INSURANCE | Admitting: Neurology

## 2015-05-14 ENCOUNTER — Encounter: Payer: Self-pay | Admitting: Neurology

## 2015-05-14 VITALS — BP 126/88 | HR 97 | Ht 70.0 in | Wt 332.0 lb

## 2015-05-14 DIAGNOSIS — G43909 Migraine, unspecified, not intractable, without status migrainosus: Secondary | ICD-10-CM

## 2015-05-14 DIAGNOSIS — G43109 Migraine with aura, not intractable, without status migrainosus: Secondary | ICD-10-CM

## 2015-05-14 MED ORDER — IBUPROFEN 800 MG PO TABS: ORAL_TABLET | ORAL | Status: DC

## 2015-05-14 NOTE — Patient Instructions (Signed)
You look great! Continue all your current medications. Take Ibuprofen only for severe migraines, do not take more than 3 a week to avoid rebound headaches. Follow-up in 6 months, call for any changes

## 2015-05-14 NOTE — Progress Notes (Addendum)
NEUROLOGY FOLLOW UP OFFICE NOTE  Patricia Giles [id number]  HISTORY OF PRESENT ILLNESS: I had the pleasure of seeing Patricia Giles in follow-up in the neurology clinic on 05/15/2015. The patient was last seen 4 months ago for numbness, dizziness, and headaches. She had initial good response to Bupropion XL with less headaches and dizziness, but reported worsening migraines on her last visit. Topamax was added which she took for 1-1/2 months, but caused cognitive slowing. She stopped the medication. She had obtained a second opinion from [facility] regarding left ear symptoms, and was diagnosed with TMJ. She reports that she is overall doing better on current medications. She has migraines mostly when stressed. She reports the dizziness is better as well. She denies any vision changes, focal numbness/tingling/weakness. No neck/back pain or falls.   HPI: This is a pleasant 35 yo RH woman with no significant past medical history who presented with several neurological symptoms including numbness, dizziness, and headaches. She reports episodic numbness occurring on and off since high school from her left shoulder down to her left foot. This would last for a few minutes, initially occurring infrequently, but now more regular around once every other month. Sometimes she feels she cannot speak, she has a thought and tries to respond but "the words are there but just not coming out." Last episode was in January 2016. She can be walking then her left leg goes numb and she loses balance. She has been told she would shift to the left side when walking, and has had to hold on to the wall. She feels the left forearm is constantly "like sandpaper" when she touches it. No facial symptoms. She occasionally has noticed difficulty gripping things with her left hand separate from the numbness episodes. Around a year ago, she started having dizzy episodes where she feels like she is floating. This can occur while sitting or  standing, does not occur when lying down. Twice she had to sit down because the room felt like it was moving for 10-15 minutes. When more intense, she has some nausea. Minor episodes last only a few minutes. These occur a couple of times a month, last episode was a week ago. In February 2016, she started having headaches over the right temporal region, described as unbearable stabbing pain. She has noticed her vision was affected, she could see clearly on the upper fields, but felt like the bottom visual field was shaking. She took pain medication and vision improved, her headache became more bearable, then came back worse the next day. It took 2 days to fully resolve. She has had smaller ones since then. She was started on Wellbutrin due to concern that headaches were due to depression. She has been having mild headaches daily, 2/10 in intensity, lasting 30-60 minutes. She has not taken any medication for these. No further vision changes, no nausea/vomiting. She denies any significant history of headaches except at age 75 or 81 she had 1 migraine and recalls being given a cocktail in the ER. She has intermittent tinnitus. She has noticed occasionally memory gaps since her late 15s, she would be driving and end up somewhere else, unsure how she got there. This has not affected her work, she just makes a lot of lists. She denies any staring/unresponsive episodes. She denies any myoclonic jerks. She takes Ativan prn for anxiety a few times a year. She was started on birth control pills for PCOS. There is no family history of migraines. She had a normal birth  and early development, no history of CNS infections, febrile convulsions, significant traumatic brain injury, family history of seizures.  Records and images were personally reviewed where available. I personally reviewed MRI brain with and without contrast which was normal. Her routine EEG and B12 level were normal.   PAST MEDICAL HISTORY: Past Medical  History  Diagnosis Date  . Seasonal allergies   . Asthma   . Anxiety   . Depression   . Cubital tunnel syndrome     both left and right  . Anemia   . Complication of anesthesia     MEDICATIONS: Current Outpatient Prescriptions on File Prior to Visit  Medication Sig Dispense Refill  . albuterol (PROVENTIL HFA;VENTOLIN HFA) 108 (90 BASE) MCG/ACT inhaler Inhale 1 puff into the lungs every 6 (six) hours as needed for wheezing or shortness of breath.    Marland Kitchen BREO ELLIPTA 200-25 MCG/INH AEPB Inhale 1 puff into the lungs daily.  6  . buPROPion (WELLBUTRIN XL) 300 MG 24 hr tablet Take 1 tablet (300 mg total) by mouth daily. Called into [place] [phone] 30 tablet 6  . cetirizine (ZYRTEC) 10 MG tablet Take 10 mg by mouth daily.    . cholecalciferol (VITAMIN D) 1000 units tablet Take 1,000 Units by mouth daily.    . fluticasone (FLONASE) 50 MCG/ACT nasal spray Place 1 spray into both nostrils daily as needed for allergies.     Marland Kitchen ibuprofen (ADVIL,MOTRIN) 800 MG tablet Take 1 tablet (800 mg total) by mouth 3 (three) times daily. 21 tablet 0  . levocetirizine (XYZAL) 5 MG tablet Take 5 mg by mouth every evening.    Marland Kitchen LORazepam (ATIVAN) 1 MG tablet Take 1 mg by mouth daily as needed for anxiety.    . montelukast (SINGULAIR) 10 MG tablet Take 10 mg by mouth at bedtime.    . vitamin B-12 (CYANOCOBALAMIN) 100 MCG tablet Take 100 mcg by mouth daily.     No current facility-administered medications on file prior to visit.    ALLERGIES: Allergies  Allergen Reactions  . Bismuth-Containing Compounds Hives  . Other     epidural  . Adhesive [Tape] Rash    FAMILY HISTORY: Family History  Problem Relation Age of Onset  . Diverticulitis Mother   . Diverticulitis Father   . Graves' disease Sister   . Cancer - Ovarian Maternal Grandmother   . Heart attack Maternal Grandfather   . Hyperlipidemia Maternal Grandfather   . Stroke Maternal Grandfather     SOCIAL HISTORY: Social  History   Social History  . Marital Status: Married    Spouse Name: N/A  . Number of Children: 1  . Years of Education: N/A   Occupational History  . Mental Health    Social History Main Topics  . Smoking status: Never Smoker   . Smokeless tobacco: Never Used  . Alcohol Use: 0.0 oz/week    0 Standard drinks or equivalent per week     Comment: socially  . Drug Use: No  . Sexual Activity: Yes    Birth Control/ Protection: None   Other Topics Concern  . Not on file   Social History Narrative    REVIEW OF SYSTEMS: Constitutional: No fevers, chills, or sweats, no generalized fatigue, change in appetite Eyes: No visual changes, double vision, eye pain Ear, nose and throat: No hearing loss, ear pain, nasal congestion, sore throat Cardiovascular: No chest pain, palpitations Respiratory:  No shortness of breath at rest or with exertion, wheezes  GastrointestinaI: No nausea, vomiting, diarrhea, abdominal pain, fecal incontinence Genitourinary:  No dysuria, urinary retention or frequency Musculoskeletal:  No neck pain, back pain Integumentary: No rash, pruritus, skin lesions Neurological: as above Psychiatric: No depression, insomnia, anxiety Endocrine: No palpitations, fatigue, diaphoresis, mood swings, change in appetite, change in weight, increased thirst Hematologic/Lymphatic:  No anemia, purpura, petechiae. Allergic/Immunologic: no itchy/runny eyes, nasal congestion, recent allergic reactions, rashes  PHYSICAL EXAM: Filed Vitals:   05/14/15 1519  BP: 126/88  Pulse: 97   General: No acute distress Head:  Normocephalic/atraumatic Neck: supple, no paraspinal tenderness, full range of motion Heart:  Regular rate and rhythm Lungs:  Clear to auscultation bilaterally Back: No paraspinal tenderness Skin/Extremities: No rash, no edema Neurological Exam: alert and oriented to person, place, and time. No aphasia or dysarthria. Fund of knowledge is appropriate.  Recent and remote  memory are intact.  Attention and concentration are normal.    Able to name objects and repeat phrases. Cranial nerves: Pupils equal, round, reactive to light.  Fundoscopic exam unremarkable, no papilledema. Extraocular movements intact with no nystagmus. Visual fields full. Facial sensation intact. No facial asymmetry. Tongue, uvula, palate midline.  Motor: Bulk and tone normal, muscle strength 5/5 throughout with no pronator drift.  Sensation to light touch intact.  No extinction to double simultaneous stimulation.  Deep tendon reflexes 2+ throughout, toes downgoing.  Finger to nose testing intact.  Gait narrow-based and steady, able to tandem walk adequately.  Romberg negative.  IMPRESSION: This is a pleasant 35 yo RH woman who presented with several neurological symptoms including recurrent episodes of left-sided numbness, new onset headaches, and dizziness. MRI brain normal. She had initial good response to Bupropion with some reduction in headaches and dizziness, then reported worsening migraines on her last visit. She did not tolerate Topamax and stopped it, but reports an improvement in migraines and dizziness on current medications (Bupropion). Seeing ENT at [facility] and helping her with the TMJ has also helped. She overall feels better with migraines mostly when stressed. Refills for Ibuprofen 800mg  prn severe migraines was sent, she knows to minimize rescue medication to 2-3 a week to avoid rebound headaches. She will continue to keep a calendar of her symptoms and follow-up in 6 months.   Thank you for allowing me to participate in her care.  Please do not hesitate to call for any questions or concerns.  The duration of this appointment visit was 15 minutes of face-to-face time with the patient.  Greater than 50% of this time was spent in counseling, explanation of diagnosis, planning of further management, and coordination of care.   Ellouise Newer, M.D.   CC: Dr. Creig Hines

## 2015-05-15 ENCOUNTER — Encounter: Payer: Self-pay | Admitting: Neurology

## 2015-05-31 ENCOUNTER — Telehealth: Payer: Self-pay | Admitting: Neurology

## 2015-05-31 NOTE — Telephone Encounter (Signed)
Pt states that her legs are numb from her bottom all the way down started last night please call her at [phone]

## 2015-05-31 NOTE — Telephone Encounter (Signed)
Returned call. She states that she is experiencing numbness & tingling in her lower body, she is also having pain with this. States this started last night and has been getting worse throughout the day. States this is different from her usual n&t episodes. No recent accident or injuries. I did advise her to go to urgent care today since this is different from what she usually experiences. She states she will get evaluated today. I advised her to let us know if she doesn't get better or worse after she's been evaluated & treated.

## 2015-11-15 ENCOUNTER — Ambulatory Visit: Payer: PRIVATE HEALTH INSURANCE | Admitting: Neurology

## 2016-12-23 ENCOUNTER — Other Ambulatory Visit: Payer: Self-pay

## 2016-12-23 MED ORDER — NORETHINDRONE 0.35 MG PO TABS: 1.0000 | ORAL_TABLET | Freq: Every day | ORAL | 0 refills | Status: DC

## 2017-01-16 ENCOUNTER — Ambulatory Visit (INDEPENDENT_AMBULATORY_CARE_PROVIDER_SITE_OTHER): Payer: Managed Care, Other (non HMO) | Admitting: Obstetrics & Gynecology

## 2017-01-16 ENCOUNTER — Encounter: Payer: Self-pay | Admitting: Obstetrics & Gynecology

## 2017-01-16 VITALS — BP 124/80 | Ht 69.0 in | Wt 333.0 lb

## 2017-01-16 DIAGNOSIS — Z3041 Encounter for surveillance of contraceptive pills: Secondary | ICD-10-CM

## 2017-01-16 DIAGNOSIS — Z6841 Body Mass Index (BMI) 40.0 and over, adult: Secondary | ICD-10-CM

## 2017-01-16 DIAGNOSIS — Z01419 Encounter for gynecological examination (general) (routine) without abnormal findings: Secondary | ICD-10-CM | POA: Diagnosis not present

## 2017-01-16 DIAGNOSIS — Z113 Encounter for screening for infections with a predominantly sexual mode of transmission: Secondary | ICD-10-CM

## 2017-01-16 MED ORDER — NORETHINDRONE 0.35 MG PO TABS: 1.0000 | ORAL_TABLET | Freq: Every day | ORAL | 4 refills | Status: DC

## 2017-01-16 NOTE — Patient Instructions (Signed)
1. Encounter for routine gynecological examination with Papanicolaou smear of cervix Normal gynecologic exam.  Pap test with HPV high risk done.  Breast exam normal.  2. Screen for STD (sexually transmitted disease) Condom use recommended - Gono-Chlam done on pap - HIV antibody (with reflex) - RPR - Hepatitis B Surface AntiGEN - Hepatitis C Antibody  3. Encounter for surveillance of contraceptive pills Well on progestin only pill for contraception and control of ovarian functional cysts.  Progestin only pill represcribed.  4. Class 3 severe obesity due to excess calories without serious comorbidity with body mass index (BMI) of 40.0 to 44.9 in adult [facility]) Successful weight loss followed at the nutrition center.  On the low calorie and low carb diet with regular physical activity.  Doing kick boxing.  On Lovey Newcomer, it was a pleasure seeing you today.  I will inform you of your lab/pap results as soon as available.  Health Maintenance, Female Adopting a healthy lifestyle and getting preventive care can go a long way to promote health and wellness. Talk with your health care provider about what schedule of regular examinations is right for you. This is a good chance for you to check in with your provider about disease prevention and staying healthy. In between checkups, there are plenty of things you can do on your own. Experts have done a lot of research about which lifestyle changes and preventive measures are most likely to keep you healthy. Ask your health care provider for more information. Weight and diet Eat a healthy diet  Be sure to include plenty of vegetables, fruits, low-fat dairy products, and lean protein.  Do not eat a lot of foods high in solid fats, added sugars, or salt.  Get regular exercise. This is one of the most important things you can do for your health. ? Most adults should exercise for at least 150 minutes each week. The exercise should increase your heart  rate and make you sweat (moderate-intensity exercise). ? Most adults should also do strengthening exercises at least twice a week. This is in addition to the moderate-intensity exercise.  Maintain a healthy weight  Body mass index (BMI) is a measurement that can be used to identify possible weight problems. It estimates body fat based on height and weight. Your health care provider can help determine your BMI and help you achieve or maintain a healthy weight.  For females 69 years of age and older: ? A BMI below 18.5 is considered underweight. ? A BMI of 18.5 to 24.9 is normal. ? A BMI of 25 to 29.9 is considered overweight. ? A BMI of 30 and above is considered obese.  Watch levels of cholesterol and blood lipids  You should start having your blood tested for lipids and cholesterol at 35 years of age, then have this test every 5 years.  You may need to have your cholesterol levels checked more often if: ? Your lipid or cholesterol levels are high. ? You are older than 35 years of age. ? You are at high risk for heart disease.  Cancer screening Lung Cancer  Lung cancer screening is recommended for adults 5-62 years old who are at high risk for lung cancer because of a history of smoking.  A yearly low-dose CT scan of the lungs is recommended for people who: ? Currently smoke. ? Have quit within the past 15 years. ? Have at least a 30-pack-year history of smoking. A pack year is smoking an average of  one pack of cigarettes a day for 1 year.  Yearly screening should continue until it has been 15 years since you quit.  Yearly screening should stop if you develop a health problem that would prevent you from having lung cancer treatment.  Breast Cancer  Practice breast self-awareness. This means understanding how your breasts normally appear and feel.  It also means doing regular breast self-exams. Let your health care provider know about any changes, no matter how small.  If  you are in your 20s or 30s, you should have a clinical breast exam (CBE) by a health care provider every 1-3 years as part of a regular health exam.  If you are 78 or older, have a CBE every year. Also consider having a breast X-ray (mammogram) every year.  If you have a family history of breast cancer, talk to your health care provider about genetic screening.  If you are at high risk for breast cancer, talk to your health care provider about having an MRI and a mammogram every year.  Breast cancer gene (BRCA) assessment is recommended for women who have family members with BRCA-related cancers. BRCA-related cancers include: ? Breast. ? Ovarian. ? Tubal. ? Peritoneal cancers.  Results of the assessment will determine the need for genetic counseling and BRCA1 and BRCA2 testing.  Cervical Cancer Your health care provider may recommend that you be screened regularly for cancer of the pelvic organs (ovaries, uterus, and vagina). This screening involves a pelvic examination, including checking for microscopic changes to the surface of your cervix (Pap test). You may be encouraged to have this screening done every 3 years, beginning at age 46.  For women ages 52-65, health care providers may recommend pelvic exams and Pap testing every 3 years, or they may recommend the Pap and pelvic exam, combined with testing for human papilloma virus (HPV), every 5 years. Some types of HPV increase your risk of cervical cancer. Testing for HPV may also be done on women of any age with unclear Pap test results.  Other health care providers may not recommend any screening for nonpregnant women who are considered low risk for pelvic cancer and who do not have symptoms. Ask your health care provider if a screening pelvic exam is right for you.  If you have had past treatment for cervical cancer or a condition that could lead to cancer, you need Pap tests and screening for cancer for at least 20 years after your  treatment. If Pap tests have been discontinued, your risk factors (such as having a new sexual partner) need to be reassessed to determine if screening should resume. Some women have medical problems that increase the chance of getting cervical cancer. In these cases, your health care provider may recommend more frequent screening and Pap tests.  Colorectal Cancer  This type of cancer can be detected and often prevented.  Routine colorectal cancer screening usually begins at 34 years of age and continues through 36 years of age.  Your health care provider may recommend screening at an earlier age if you have risk factors for colon cancer.  Your health care provider may also recommend using home test kits to check for hidden blood in the stool.  A small camera at the end of a tube can be used to examine your colon directly (sigmoidoscopy or colonoscopy). This is done to check for the earliest forms of colorectal cancer.  Routine screening usually begins at age 46.  Direct examination of the colon should  be repeated every 5-10 years through 36 years of age. However, you may need to be screened more often if early forms of precancerous polyps or small growths are found.  Skin Cancer  Check your skin from head to toe regularly.  Tell your health care provider about any new moles or changes in moles, especially if there is a change in a mole's shape or color.  Also tell your health care provider if you have a mole that is larger than the size of a pencil eraser.  Always use sunscreen. Apply sunscreen liberally and repeatedly throughout the day.  Protect yourself by wearing long sleeves, pants, a wide-brimmed hat, and sunglasses whenever you are outside.  Heart disease, diabetes, and high blood pressure  High blood pressure causes heart disease and increases the risk of stroke. High blood pressure is more likely to develop in: ? People who have blood pressure in the high end of the normal  range (130-139/85-89 mm Hg). ? People who are overweight or obese. ? People who are African American.  If you are 58-85 years of age, have your blood pressure checked every 3-5 years. If you are 55 years of age or older, have your blood pressure checked every year. You should have your blood pressure measured twice-once when you are at a hospital or clinic, and once when you are not at a hospital or clinic. Record the average of the two measurements. To check your blood pressure when you are not at a hospital or clinic, you can use: ? An automated blood pressure machine at a pharmacy. ? A home blood pressure monitor.  If you are between 54 years and 60 years old, ask your health care provider if you should take aspirin to prevent strokes.  Have regular diabetes screenings. This involves taking a blood sample to check your fasting blood sugar level. ? If you are at a normal weight and have a low risk for diabetes, have this test once every three years after 35 years of age. ? If you are overweight and have a high risk for diabetes, consider being tested at a younger age or more often. Preventing infection Hepatitis B  If you have a higher risk for hepatitis B, you should be screened for this virus. You are considered at high risk for hepatitis B if: ? You were born in a country where hepatitis B is common. Ask your health care provider which countries are considered high risk. ? Your parents were born in a high-risk country, and you have not been immunized against hepatitis B (hepatitis B vaccine). ? You have HIV or AIDS. ? You use needles to inject street drugs. ? You live with someone who has hepatitis B. ? You have had sex with someone who has hepatitis B. ? You get hemodialysis treatment. ? You take certain medicines for conditions, including cancer, organ transplantation, and autoimmune conditions.  Hepatitis C  Blood testing is recommended for: ? Everyone born from [birth year] through  1964. ? Anyone with known risk factors for hepatitis C.  Sexually transmitted infections (STIs)  You should be screened for sexually transmitted infections (STIs) including gonorrhea and chlamydia if: ? You are sexually active and are younger than 35 years of age. ? You are older than 35 years of age and your health care provider tells you that you are at risk for this type of infection. ? Your sexual activity has changed since you were last screened and you are at an increased risk  for chlamydia or gonorrhea. Ask your health care provider if you are at risk.  If you do not have HIV, but are at risk, it may be recommended that you take a prescription medicine daily to prevent HIV infection. This is called pre-exposure prophylaxis (PrEP). You are considered at risk if: ? You are sexually active and do not regularly use condoms or know the HIV status of your partner(s). ? You take drugs by injection. ? You are sexually active with a partner who has HIV.  Talk with your health care provider about whether you are at high risk of being infected with HIV. If you choose to begin PrEP, you should first be tested for HIV. You should then be tested every 3 months for as long as you are taking PrEP. Pregnancy  If you are premenopausal and you may become pregnant, ask your health care provider about preconception counseling.  If you may become pregnant, take 400 to 800 micrograms (mcg) of folic acid every day.  If you want to prevent pregnancy, talk to your health care provider about birth control (contraception). Osteoporosis and menopause  Osteoporosis is a disease in which the bones lose minerals and strength with aging. This can result in serious bone fractures. Your risk for osteoporosis can be identified using a bone density scan.  If you are 74 years of age or older, or if you are at risk for osteoporosis and fractures, ask your health care provider if you should be screened.  Ask your health  care provider whether you should take a calcium or vitamin D supplement to lower your risk for osteoporosis.  Menopause may have certain physical symptoms and risks.  Hormone replacement therapy may reduce some of these symptoms and risks. Talk to your health care provider about whether hormone replacement therapy is right for you. Follow these instructions at home:  Schedule regular health, dental, and eye exams.  Stay current with your immunizations.  Do not use any tobacco products including cigarettes, chewing tobacco, or electronic cigarettes.  If you are pregnant, do not drink alcohol.  If you are breastfeeding, limit how much and how often you drink alcohol.  Limit alcohol intake to no more than 1 drink per day for nonpregnant women. One drink equals 12 ounces of beer, 5 ounces of wine, or 1 ounces of hard liquor.  Do not use street drugs.  Do not share needles.  Ask your health care provider for help if you need support or information about quitting drugs.  Tell your health care provider if you often feel depressed.  Tell your health care provider if you have ever been abused or do not feel safe at home. This information is not intended to replace advice given to you by your health care provider. Make sure you discuss any questions you have with your health care provider. Document Released: 09/02/2010 Document Revised: 07/26/2015 Document Reviewed: 11/21/2014 Elsevier Interactive Patient Education  [employer].

## 2017-01-16 NOTE — Addendum Note (Signed)
Addended by: Nelva Nay on: 01/16/2017 11:01 AM   Modules accepted: Orders

## 2017-01-16 NOTE — Progress Notes (Signed)
Patricia Giles 05-09-1980 [id number]   History:    36 y.o. G3P1A2L1 Divorcing.  Abstinent currently  RP:  Established patient presenting  for annual gyn exam   HPI: Well on progestin only pill to control ovarian cysts and for contraception.  No abnormal bleeding.  No pelvic pain.  Normal vaginal secretions.  Currently in the process of divorcing but was sexually active with her husband and would like STD screening.  Breasts normal.  Followed at the nutrition center for weight loss.  Low calorie and low carb diet with regular physical activity.  Enjoys kick boxing.  Currently on Xyzal.  Lost 35 pounds since last year.  Current body mass index at 49.18.  Urine and bowel movements normal.  Past medical history,surgical history, family history and social history were all reviewed and documented in the EPIC chart.  Gynecologic History Patient's last menstrual period was 01/02/2017. Contraception: oral progesterone-only contraceptive Last Pap: 01/2016. Results were: Negative/HPV HR neg Last mammogram: Never  Obstetric History OB History  Gravida Para Term Preterm AB Living  3 1     2 1   SAB TAB Ectopic Multiple Live Births  1 1          # Outcome Date GA Lbr Len/2nd Weight Sex Delivery Anes PTL Lv  3 TAB           2 SAB           1 Para                ROS: A ROS was performed and pertinent positives and negatives are included in the history.  GENERAL: No fevers or chills. HEENT: No change in vision, no earache, sore throat or sinus congestion. NECK: No pain or stiffness. CARDIOVASCULAR: No chest pain or pressure. No palpitations. PULMONARY: No shortness of breath, cough or wheeze. GASTROINTESTINAL: No abdominal pain, nausea, vomiting or diarrhea, melena or bright red blood per rectum. GENITOURINARY: No urinary frequency, urgency, hesitancy or dysuria. MUSCULOSKELETAL: No joint or muscle pain, no back pain, no recent trauma. DERMATOLOGIC: No rash, no itching, no lesions. ENDOCRINE: No  polyuria, polydipsia, no heat or cold intolerance. No recent change in weight. HEMATOLOGICAL: No anemia or easy bruising or bleeding. NEUROLOGIC: No headache, seizures, numbness, tingling or weakness. PSYCHIATRIC: No depression, no loss of interest in normal activity or change in sleep pattern.     Exam:   BP 124/80 (Cuff Size: Large)   Ht 5\' 9"  (1.753 m)   Wt (!) 333 lb (151 kg)   LMP 01/02/2017   BMI 49.18 kg/m   Body mass index is 49.18 kg/m.  General appearance : Well developed well nourished female. No acute distress HEENT: Eyes: no retinal hemorrhage or exudates,  Neck supple, trachea midline, no carotid bruits, no thyroidmegaly Lungs: Clear to auscultation, no rhonchi or wheezes, or rib retractions  Heart: Regular rate and rhythm, no murmurs or gallops Breast:Examined in sitting and supine position were symmetrical in appearance, no palpable masses or tenderness,  no skin retraction, no nipple inversion, no nipple discharge, no skin discoloration, no axillary or supraclavicular lymphadenopathy Abdomen: no palpable masses or tenderness, no rebound or guarding Extremities: no edema or skin discoloration or tenderness  Pelvic: Vulva normal  Bartholin, Urethra, Skene Glands: Within normal limits             Vagina: No gross lesions or discharge  Cervix: No gross lesions or discharge.  Pap/HPV HR, Gono-Chlam done.  Uterus  AV, normal size, shape  and consistency, non-tender and mobile  Adnexa  Without masses or tenderness  Anus and perineum  normal    Assessment/Plan:  36 y.o. female for annual exam   1. Encounter for routine gynecological examination with Papanicolaou smear of cervix Normal gynecologic exam.  Pap test with HPV high risk done.  Breast exam normal.  2. Screen for STD (sexually transmitted disease) Condom use recommended - Gono-Chlam done on pap - HIV antibody (with reflex) - RPR - Hepatitis B Surface AntiGEN - Hepatitis C Antibody  3. Encounter for  surveillance of contraceptive pills Well on progestin only pill for contraception and control of ovarian functional cysts.  Progestin only pill represcribed.  4. Class 3 severe obesity due to excess calories without serious comorbidity with body mass index (BMI) of 40.0 to 44.9 in adult [facility]) Successful weight loss followed at the nutrition center.  On the low calorie and low carb diet with regular physical activity.  Doing kick boxing.  On Larey Seat MD, 9:41 AM 01/16/2017

## 2017-01-19 LAB — HEPATITIS C ANTIBODY
HEP C AB: NONREACTIVE
SIGNAL TO CUT-OFF: 0.02 (ref <1.00)

## 2017-01-19 LAB — RPR: RPR: NONREACTIVE

## 2017-01-19 LAB — HIV ANTIBODY (ROUTINE TESTING W REFLEX): HIV: NONREACTIVE

## 2017-01-19 LAB — HEPATITIS B SURFACE ANTIGEN: HEP B S AG: NONREACTIVE

## 2017-01-21 LAB — PAP IG, CT-NG NAA, HPV HIGH-RISK
C. TRACHOMATIS RNA, TMA: NOT DETECTED
HPV DNA High Risk: NOT DETECTED
N. GONORRHOEAE RNA, TMA: NOT DETECTED

## 2017-07-28 ENCOUNTER — Ambulatory Visit: Payer: Managed Care, Other (non HMO) | Admitting: Women's Health

## 2017-07-28 ENCOUNTER — Encounter: Payer: Self-pay | Admitting: Women's Health

## 2017-07-28 ENCOUNTER — Other Ambulatory Visit: Payer: Self-pay | Admitting: Women's Health

## 2017-07-28 ENCOUNTER — Ambulatory Visit (INDEPENDENT_AMBULATORY_CARE_PROVIDER_SITE_OTHER): Payer: Managed Care, Other (non HMO)

## 2017-07-28 VITALS — BP 122/80

## 2017-07-28 DIAGNOSIS — R1032 Left lower quadrant pain: Secondary | ICD-10-CM

## 2017-07-28 DIAGNOSIS — Z113 Encounter for screening for infections with a predominantly sexual mode of transmission: Secondary | ICD-10-CM | POA: Diagnosis not present

## 2017-07-28 DIAGNOSIS — N839 Noninflammatory disorder of ovary, fallopian tube and broad ligament, unspecified: Secondary | ICD-10-CM | POA: Diagnosis not present

## 2017-07-28 DIAGNOSIS — N83202 Unspecified ovarian cyst, left side: Secondary | ICD-10-CM | POA: Diagnosis not present

## 2017-07-28 DIAGNOSIS — R102 Pelvic and perineal pain: Secondary | ICD-10-CM

## 2017-07-28 DIAGNOSIS — N838 Other noninflammatory disorders of ovary, fallopian tube and broad ligament: Secondary | ICD-10-CM

## 2017-07-28 LAB — WET PREP FOR TRICH, YEAST, CLUE

## 2017-07-28 NOTE — Patient Instructions (Signed)
Ovarian Cyst An ovarian cyst is a fluid-filled sac that forms on an ovary. The ovaries are small organs that produce eggs in women. Various types of cysts can form on the ovaries. Some may cause symptoms and require treatment. Most ovarian cysts go away on their own, are not cancerous (are benign), and do not cause problems. Common types of ovarian cysts include:  Functional (follicle) cysts. ? Occur during the menstrual cycle, and usually go away with the next menstrual cycle if you do not get pregnant. ? Usually cause no symptoms.  Endometriomas. ? Are cysts that form from the tissue that lines the uterus (endometrium). ? Are sometimes called "chocolate cysts" because they become filled with blood that turns brown. ? Can cause pain in the lower abdomen during intercourse and during your period.  Cystadenoma cysts. ? Develop from cells on the outside surface of the ovary. ? Can get very large and cause lower abdomen pain and pain with intercourse. ? Can cause severe pain if they twist or break open (rupture).  Dermoid cysts. ? Are sometimes found in both ovaries. ? May contain different kinds of body tissue, such as skin, teeth, hair, or cartilage. ? Usually do not cause symptoms unless they get very big.  Theca lutein cysts. ? Occur when too much of a certain hormone (human chorionic gonadotropin) is produced and overstimulates the ovaries to produce an egg. ? Are most common after having procedures used to assist with the conception of a baby (in vitro fertilization).  What are the causes? Ovarian cysts may be caused by:  Ovarian hyperstimulation syndrome. This is a condition that can develop from taking fertility medicines. It causes multiple large ovarian cysts to form.  Polycystic ovarian syndrome (PCOS). This is a common hormonal disorder that can cause ovarian cysts, as well as problems with your period or fertility.  What increases the risk? The following factors may make  you more likely to develop ovarian cysts:  Being overweight or obese.  Taking fertility medicines.  Taking certain forms of hormonal birth control.  Smoking.  What are the signs or symptoms? Many ovarian cysts do not cause symptoms. If symptoms are present, they may include:  Pelvic pain or pressure.  Pain in the lower abdomen.  Pain during sex.  Abdominal swelling.  Abnormal menstrual periods.  Increasing pain with menstrual periods.  How is this diagnosed? These cysts are commonly found during a routine pelvic exam. You may have tests to find out more about the cyst, such as:  Ultrasound.  X-ray of the pelvis.  CT scan.  MRI.  Blood tests.  How is this treated? Many ovarian cysts go away on their own without treatment. Your health care provider may want to check your cyst regularly for 2-3 months to see if it changes. If you are in menopause, it is especially important to have your cyst monitored closely because menopausal women have a higher rate of ovarian cancer. When treatment is needed, it may include:  Medicines to help relieve pain.  A procedure to drain the cyst (aspiration).  Surgery to remove the whole cyst.  Hormone treatment or birth control pills. These methods are sometimes used to help dissolve a cyst.  Follow these instructions at home:  Take over-the-counter and prescription medicines only as told by your health care provider.  Do not drive or use heavy machinery while taking prescription pain medicine.  Get regular pelvic exams and Pap tests as often as told by your health care   provider.  Return to your normal activities as told by your health care provider. Ask your health care provider what activities are safe for you.  Do not use any products that contain nicotine or tobacco, such as cigarettes and e-cigarettes. If you need help quitting, ask your health care provider.  Keep all follow-up visits as told by your health care provider.  This is important. Contact a health care provider if:  Your periods are late, irregular, or painful, or they stop.  You have pelvic pain that does not go away.  You have pressure on your bladder or trouble emptying your bladder completely.  You have pain during sex.  You have any of the following in your abdomen: ? A feeling of fullness. ? Pressure. ? Discomfort. ? Pain that does not go away. ? Swelling.  You feel generally ill.  You become constipated.  You lose your appetite.  You develop severe acne.  You start to have more body hair and facial hair.  You are gaining weight or losing weight without changing your exercise and eating habits.  You think you may be pregnant. Get help right away if:  You have abdominal pain that is severe or gets worse.  You cannot eat or drink without vomiting.  You suddenly develop a fever.  Your menstrual period is much heavier than usual. This information is not intended to replace advice given to you by your health care provider. Make sure you discuss any questions you have with your health care provider. Document Released: 02/17/2005 Document Revised: 09/07/2015 Document Reviewed: 07/22/2015 Elsevier Interactive Patient Education  2018 [employer]  

## 2017-07-28 NOTE — Progress Notes (Signed)
37 year old DBF G3, P1 resents with complaint of left lower abdominal pain for the past 3 weeks. increasing in severity.  Rates today at an 8-10 yesterday 5-6.  History of ovarian cysts similar type current pain more intense.  Pain is at its worse when the bladder is full slightly relieved with emptying the bladder.  Denies vaginal discharge, urinary symptoms of pain or burning or pain at end of stream of urination.  New partner for 3 months.  Monthly cycle on Micronor first 2 days heavy day 3 and 4 light.  Exam:  Appears in mild discomfort, obese, external genitalia within normal limits, speculum exam no visible discharge or erythema, no odor noted, wet prep negative.  GC/Chlamydia culture pending. Ultrasound: T/V retroverted uterus with anterior subserous fibroids 20 x 18 mm.  Right ovary microcalcifications 3 mm, ovary positive arteriovenous CFD.  Left ovary positive arterial and venous CFD to ovarian tissue, 2 cysts 51 x 46 x 41 mm particular echo pattern positive CFD periphery, 30 x 25 x 21 mm, internal thin septations negative CFD.  Free fluid adjacent to the left ovary 24 x 27 mm.  Probable hemorrhagic left ovarian cyst  Plan: Options reviewed, will repeat ultrasound in 2 months to check for resolution, most likely functional and will resolve .  Motrin over-the-counter for discomfort, instructed to call if continued pain.  Continue Micronor daily and encouraged condoms until permanent partner.Marland Kitchen

## 2017-07-29 LAB — C. TRACHOMATIS/N. GONORRHOEAE RNA
C. trachomatis RNA, TMA: NOT DETECTED
N. GONORRHOEAE RNA, TMA: NOT DETECTED

## 2017-07-31 ENCOUNTER — Ambulatory Visit: Payer: Managed Care, Other (non HMO) | Admitting: Obstetrics & Gynecology

## 2017-07-31 ENCOUNTER — Encounter: Payer: Self-pay | Admitting: Obstetrics & Gynecology

## 2017-07-31 VITALS — BP 126/82

## 2017-07-31 DIAGNOSIS — N83202 Unspecified ovarian cyst, left side: Secondary | ICD-10-CM

## 2017-07-31 DIAGNOSIS — R102 Pelvic and perineal pain: Secondary | ICD-10-CM

## 2017-07-31 MED ORDER — TRAMADOL HCL 50 MG PO TABS: 50.0000 mg | ORAL_TABLET | Freq: Four times a day (QID) | ORAL | 0 refills | Status: DC | PRN

## 2017-07-31 NOTE — Progress Notes (Signed)
    Patricia Giles [birth year] [id number]        37 y.o.  [id number]   RP: LLQ pain persisting x last visit 5/28th  HPI: Patient seen by Elon Alas NP on 5/28th for the same problem:  Left lower abdominal pain for the past 3 weeks. increasing in severity.  Rates today at an 8-10 yesterday 5-6.  History of ovarian cysts similar type current pain more intense.  Pain is at its worse when the bladder is full slightly relieved with emptying the bladder.  Denies vaginal discharge, urinary symptoms of pain or burning or pain at end of stream of urination.  New partner for 3 months.  Monthly cycle on Micronor first 2 days heavy day 3 and 4 light.    At that visit, a pelvic US was done 07/28/17: Ultrasound: T/V retroverted uterus with anterior subserous fibroids 20 x 18 mm.  Right ovary microcalcifications 3 mm, ovary positive arteriovenous CFD.  Left ovary positive arterial and venous CFD to ovarian tissue, 2 cysts 51 x 46 x 41 mm particular echo pattern positive CFD periphery, 30 x 25 x 21 mm, internal thin septations negative CFD.  Free fluid adjacent to the left ovary 24 x 27 mm.  Since then, the pain has remained about the same.  Patient is taking NSAIDs as needed.  No N/V, no diarrhea, no UTI Sx, no fever.  No dizziness, no fainting.    OB History  Gravida Para Term Preterm AB Living  3 1     2 1   SAB TAB Ectopic Multiple Live Births  1 1          # Outcome Date GA Lbr Len/2nd Weight Sex Delivery Anes PTL Lv  3 TAB           2 SAB           1 Para             Past medical history,surgical history, problem list, medications, allergies, family history and social history were all reviewed and documented in the EPIC chart.   Directed ROS with pertinent positives and negatives documented in the history of present illness/assessment and plan.  Exam:  Vitals:   07/31/17 1042  BP: 126/82   General appearance:  Normal  Abdomen: Soft, not distended, mildly tender.  BS present.  Gynecologic  exam: Vulva normal.  Bimanual exam: Uterus retroverted.  No adnexal mass felt and only mild tenderness left more than right.   Assessment/Plan:  37 y.o. [id number]   1. Left ovarian cyst To left functional cyst per ultrasound on May 28 days measuring 5.1 cm and 3 cm.  The flow to the ovary was present.  Probably in the process of ovarian cyst rupture.  No acute abdomen.  Will use ibuprofen 800 mg every 8 hours regularly for 2 days.  Will add tramadol as needed.  Patient recommended to present at [facility] if no improvement in her symptoms in the next 2 days.  2. Pelvic pain in female As above.  Other orders - traMADol (ULTRAM) 50 MG tablet; Take 1 tablet (50 mg total) by mouth every 6 (six) hours as needed.  Counseling on above issues and coordination of care more than 50% for 25 minutes.  [name], 11:10 AM 07/31/2017

## 2017-08-02 ENCOUNTER — Encounter: Payer: Self-pay | Admitting: Obstetrics & Gynecology

## 2017-08-02 NOTE — Patient Instructions (Signed)
1. Left ovarian cyst To left functional cyst per ultrasound on May 28 days measuring 5.1 cm and 3 cm.  The flow to the ovary was present.  Probably in the process of ovarian cyst rupture.  No acute abdomen.  Will use ibuprofen 800 mg every 8 hours regularly for 2 days.  Will add tramadol as needed.  Patient recommended to present at [facility] if no improvement in her symptoms in the next 2 days.  2. Pelvic pain in female As above.  Other orders - traMADol (ULTRAM) 50 MG tablet; Take 1 tablet (50 mg total) by mouth every 6 (six) hours as needed.  Patricia Giles, it was a pleasure seeing you today!

## 2017-09-21 ENCOUNTER — Ambulatory Visit (INDEPENDENT_AMBULATORY_CARE_PROVIDER_SITE_OTHER): Payer: Managed Care, Other (non HMO)

## 2017-09-21 ENCOUNTER — Encounter: Payer: Self-pay | Admitting: Women's Health

## 2017-09-21 ENCOUNTER — Ambulatory Visit: Payer: Managed Care, Other (non HMO) | Admitting: Women's Health

## 2017-09-21 VITALS — BP 122/80

## 2017-09-21 DIAGNOSIS — N83202 Unspecified ovarian cyst, left side: Secondary | ICD-10-CM

## 2017-09-21 DIAGNOSIS — R1032 Left lower quadrant pain: Secondary | ICD-10-CM

## 2017-09-21 NOTE — Patient Instructions (Signed)

## 2017-09-21 NOTE — Progress Notes (Signed)
37 year old BW G3, P1 presents with for follow-up ultrasound.  07/2017 was having left lower quadrant pain  had a left ovarian cyst 51 x 46 x 41 mm.  Pain resolved now.  On Micronor with regular monthly 7-day cycle first 3 days heavy changing protection every 2 hours.  Morbid obesity is scheduled for follow-up to discuss bariatric surgery.  Counselor.  Denies abdominal pain, urinary symptoms, vaginal discharge or fever.  Exam: Appears well.  Ultrasound: T/V retroverted uterus with no change in intramural fibroids.  Right ovary normal surrounded by free fluid.  Left ovary normal.  CDS fluid on the right 63 x 36 x 56 mm, echo-free.  Fibroid uterus on Micronor Resolved left ovarian cyst  Plan: Reviewed ultrasound and resolution of left ovarian cyst.  Instructed to continue keeping menstrual record, options of Mirena IUD, endometrial ablation discussed if menorrhagia persists.

## 2017-11-11 DIAGNOSIS — Z9884 Bariatric surgery status: Secondary | ICD-10-CM

## 2017-11-11 HISTORY — DX: Bariatric surgery status: Z98.84

## 2018-01-08 ENCOUNTER — Encounter: Payer: Self-pay | Admitting: Obstetrics & Gynecology

## 2018-01-08 ENCOUNTER — Ambulatory Visit (INDEPENDENT_AMBULATORY_CARE_PROVIDER_SITE_OTHER): Payer: Managed Care, Other (non HMO) | Admitting: Obstetrics & Gynecology

## 2018-01-08 VITALS — BP 126/74

## 2018-01-08 DIAGNOSIS — Z9884 Bariatric surgery status: Secondary | ICD-10-CM

## 2018-01-08 DIAGNOSIS — Z30015 Encounter for initial prescription of vaginal ring hormonal contraceptive: Secondary | ICD-10-CM | POA: Diagnosis not present

## 2018-01-08 DIAGNOSIS — N898 Other specified noninflammatory disorders of vagina: Secondary | ICD-10-CM

## 2018-01-08 LAB — WET PREP FOR TRICH, YEAST, CLUE

## 2018-01-08 MED ORDER — ETONOGESTREL-ETHINYL ESTRADIOL 0.12-0.015 MG/24HR VA RING: 1.0000 | VAGINAL_RING | VAGINAL | 4 refills | Status: DC

## 2018-01-08 NOTE — Progress Notes (Signed)
    Patricia Giles 1980/07/12 [id number]        37 y.o.  [id number] Separated, divorcing.  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  No Complication.  Lost more than 20 Lbs x surgery.   Vaginal discharge with odor since surgery.  No itching.  No pelvic pain.  Stopped Camila, told that it would not be efficient in the context of a Gastric By-Pass with significant weight loss/decreased adipose tissue.  Abstinent. Declines STI screen.   OB History  Gravida Para Term Preterm AB Living  3 1     2 1   SAB TAB Ectopic Multiple Live Births  1 1          # Outcome Date GA Lbr Len/2nd Weight Sex Delivery Anes PTL Lv  3 TAB           2 SAB           1 Para             Past medical history,surgical history, problem list, medications, allergies, family history and social history were all reviewed and documented in the EPIC chart.   Directed ROS with pertinent positives and negatives documented in the history of present illness/assessment and plan.  Exam:  Vitals:   01/08/18 1553  BP: 126/74   General appearance:  Normal  Gynecologic exam: Vulva normal.  Speculum:  Cervix normal.  Vagina normal.  Wet prep done.  Wet prep:  Negative   Assessment/Plan:  37 y.o. [id number]   1. Vaginal odor Normal gynecologic exam and wet prep negative.  Patient reassured.  Recommend using probiotic as needed.  Informed about boric acid availability over-the-counter. - WET PREP FOR TRICH, YEAST, CLUE  2. Encounter for initial prescription of vaginal ring hormonal contraceptive Contraception counseling done.  Patient declines progesterone IUDs.  Recent gastric bypass in September 2019 with projected significant weight loss.  Decision to start on Nuvaring.  No contraindication to estrogen/progestin contraception.  Blood pressure normal at 126/74 today.  Usage, risks and benefits reviewed with patient.  Prescription sent to pharmacy.  3. H/O gastric bypass No complication.  Other  orders - etonogestrel-ethinyl estradiol (NUVARING) 0.12-0.015 MG/24HR vaginal ring; Place 1 each vaginally every 28 (twenty-eight) days. Insert vaginally and leave in place for 3 consecutive weeks, then remove for 1 week.  Counseling on above issues and coordination of care more than 50% for 25 minutes.  [name], 4:06 PM 01/08/2018

## 2018-01-08 NOTE — Patient Instructions (Signed)
1. Vaginal odor Normal gynecologic exam and wet prep negative.  Patient reassured.  Recommend using probiotic as needed.  Informed about boric acid availability over-the-counter. - WET PREP FOR TRICH, YEAST, CLUE  2. Encounter for initial prescription of vaginal ring hormonal contraceptive Contraception counseling done.  Patient declines progesterone IUDs.  Recent gastric bypass in September 2019 with projected significant weight loss.  Decision to start on Nuvaring.  No contraindication to estrogen/progestin contraception.  Blood pressure normal at 126/74 today.  Usage, risks and benefits reviewed with patient.  Prescription sent to pharmacy.  3. H/O gastric bypass No complication.  Other orders - etonogestrel-ethinyl estradiol (NUVARING) 0.12-0.015 MG/24HR vaginal ring; Place 1 each vaginally every 28 (twenty-eight) days. Insert vaginally and leave in place for 3 consecutive weeks, then remove for 1 week.  Patricia Giles, it was a pleasure seeing you today!

## 2018-01-18 ENCOUNTER — Encounter: Payer: Managed Care, Other (non HMO) | Admitting: Obstetrics & Gynecology

## 2018-01-25 ENCOUNTER — Other Ambulatory Visit: Payer: Self-pay | Admitting: Obstetrics & Gynecology

## 2018-02-05 ENCOUNTER — Telehealth: Payer: Self-pay | Admitting: *Deleted

## 2018-02-05 ENCOUNTER — Encounter: Payer: Self-pay | Admitting: Obstetrics & Gynecology

## 2018-02-05 ENCOUNTER — Ambulatory Visit (INDEPENDENT_AMBULATORY_CARE_PROVIDER_SITE_OTHER): Payer: Managed Care, Other (non HMO) | Admitting: Obstetrics & Gynecology

## 2018-02-05 VITALS — BP 116/78 | Ht 69.5 in | Wt 285.0 lb

## 2018-02-05 DIAGNOSIS — N766 Ulceration of vulva: Secondary | ICD-10-CM

## 2018-02-05 DIAGNOSIS — Z113 Encounter for screening for infections with a predominantly sexual mode of transmission: Secondary | ICD-10-CM | POA: Diagnosis not present

## 2018-02-05 DIAGNOSIS — N63 Unspecified lump in unspecified breast: Secondary | ICD-10-CM

## 2018-02-05 DIAGNOSIS — Z01419 Encounter for gynecological examination (general) (routine) without abnormal findings: Secondary | ICD-10-CM | POA: Diagnosis not present

## 2018-02-05 DIAGNOSIS — Z3044 Encounter for surveillance of vaginal ring hormonal contraceptive device: Secondary | ICD-10-CM | POA: Diagnosis not present

## 2018-02-05 DIAGNOSIS — N6325 Unspecified lump in the left breast, overlapping quadrants: Secondary | ICD-10-CM

## 2018-02-05 DIAGNOSIS — Z1151 Encounter for screening for human papillomavirus (HPV): Secondary | ICD-10-CM

## 2018-02-05 DIAGNOSIS — N6315 Unspecified lump in the right breast, overlapping quadrants: Secondary | ICD-10-CM | POA: Diagnosis not present

## 2018-02-05 DIAGNOSIS — Z6841 Body Mass Index (BMI) 40.0 and over, adult: Secondary | ICD-10-CM

## 2018-02-05 DIAGNOSIS — E66813 Obesity, class 3: Secondary | ICD-10-CM

## 2018-02-05 MED ORDER — ETONOGESTREL-ETHINYL ESTRADIOL 0.12-0.015 MG/24HR VA RING: 1.0000 | VAGINAL_RING | VAGINAL | 4 refills | Status: DC

## 2018-02-05 NOTE — Telephone Encounter (Signed)
Orders placed at breast center I called patient and asked if she preferred to call and schedule. Patient said yes number given to call.

## 2018-02-05 NOTE — Progress Notes (Signed)
Patricia Giles 01/09/1981 [id number]   History:    37 y.o. G3P1A2L1 Divorcing.    RP:  Established patient presenting for annual gyn exam   HPI: Well on Nuvaring.  No BTB.  Mild Rt pelvic discomfort x 2 days.  No abnormal vaginal d/c.  Sexually active with new partner last week, used condoms.  C/O irritation/burning at right introitus.  H/O Genital HSV.  Urine/BMs wnl.  Breasts wnl.  BMI 41.48.  Lost 48 Lbs x last year.    Past medical history,surgical history, family history and social history were all reviewed and documented in the EPIC chart.  Gynecologic History Patient's last menstrual period was 01/13/2018. Contraception: NuvaRing vaginal inserts Last Pap: 01/2017. Results were: Negative/HPV HR neg Last mammogram: Never Bone Density: Never Colonoscopy: Never  Obstetric History OB History  Gravida Para Term Preterm AB Living  3 1     2 1   SAB TAB Ectopic Multiple Live Births  1 1          # Outcome Date GA Lbr Len/2nd Weight Sex Delivery Anes PTL Lv  3 TAB           2 SAB           1 Para              ROS: A ROS was performed and pertinent positives and negatives are included in the history.  GENERAL: No fevers or chills. HEENT: No change in vision, no earache, sore throat or sinus congestion. NECK: No pain or stiffness. CARDIOVASCULAR: No chest pain or pressure. No palpitations. PULMONARY: No shortness of breath, cough or wheeze. GASTROINTESTINAL: No abdominal pain, nausea, vomiting or diarrhea, melena or bright red blood per rectum. GENITOURINARY: No urinary frequency, urgency, hesitancy or dysuria. MUSCULOSKELETAL: No joint or muscle pain, no back pain, no recent trauma. DERMATOLOGIC: No rash, no itching, no lesions. ENDOCRINE: No polyuria, polydipsia, no heat or cold intolerance. No recent change in weight. HEMATOLOGICAL: No anemia or easy bruising or bleeding. NEUROLOGIC: No headache, seizures, numbness, tingling or weakness. PSYCHIATRIC: No depression, no loss of  interest in normal activity or change in sleep pattern.     Exam:   BP 116/78   Ht 5' 9.5" (1.765 m)   Wt 285 lb (129.3 kg)   LMP 01/13/2018 Comment: nuvaring  BMI 41.48 kg/m   Body mass index is 41.48 kg/m.  General appearance : Well developed well nourished female. No acute distress HEENT: Eyes: no retinal hemorrhage or exudates,  Neck supple, trachea midline, no carotid bruits, no thyroidmegaly Lungs: Clear to auscultation, no rhonchi or wheezes, or rib retractions  Heart: Regular rate and rhythm, no murmurs or gallops Breast:Examined in sitting and supine position were symmetrical in appearance, no palpable masses or tenderness, but increased density of the breasts at the mid internal quadrant bilaterally.  No skin retraction, no nipple inversion, no nipple discharge, no skin discoloration, no axillary or supraclavicular lymphadenopathy Abdomen: no palpable masses or tenderness, no rebound or guarding Extremities: no edema or skin discoloration or tenderness  Pelvic: Vulva: Small ulcers at post fourchette and right introitus.  HSV Sureswab done.             Vagina: No gross lesions or discharge  Cervix: No gross lesions or discharge.  Pap/HPV HR, Gono-Chlam done  Uterus  AV, normal size, shape and consistency, non-tender and mobile  Adnexa  Without masses or tenderness  Anus: Normal   Assessment/Plan:  37 y.o. female for annual  exam   1. Encounter for routine gynecological examination with Papanicolaou smear of cervix Normal gynecologic exam.  Pap test with high risk HPV done today.  Breast exam normal.  2. Encounter for surveillance of vaginal ring hormonal contraceptive device Well on NuvaRing.  No contraindication to continue.  NuvaRing represcribed.  3. Breast lump in female Bilateral increase in density at the mid internal quadrant of the breasts.  Probable fibrocystic breast at those location.  We will proceed with a bilateral diagnostic mammogram and breast  ultrasound.  4. Screen for STD (sexually transmitted disease) Recommend continuing with strict condom use. - Gono-Chlam done - HIV antibody (with reflex) - RPR - Hepatitis C Antibody - Hepatitis B Surface AntiGEN  5. Vulvar ulcer Probable recurrence of genital herpes.  Sure swab HSV done. - SureSwab HSV, Type 1/2 DNA, PCR  6. Class 3 severe obesity due to excess calories without serious comorbidity with body mass index (BMI) of 40.0 to 44.9 in adult [facility]) Obesity with body mass index at 41.48.  Lost 48 pounds since last year.  Continue with low calorie diet such as [employer].  Recommend aerobic physical activities 5 times a week and weightlifting every 2 days.  Other orders - etonogestrel-ethinyl estradiol (NUVARING) 0.12-0.015 MG/24HR vaginal ring; Place 1 each vaginally every 28 (twenty-eight) days. Insert vaginally and leave in place for 3 consecutive weeks, then remove for 1 week.  [name], 12:51 PM 02/05/2018

## 2018-02-05 NOTE — Telephone Encounter (Signed)
-----   Message from [name], MD sent at 02/05/2018  1:14 PM EST ----- Regarding: Schedule bilateral Dx mammo/US Bilateral increase density/Fibrocystic area? At inner mid breasts (Rt 3 O'clock, Lt 9 O'clock).

## 2018-02-05 NOTE — Telephone Encounter (Signed)
appointment on 02/12/18 @ 11:20am

## 2018-02-07 ENCOUNTER — Encounter: Payer: Self-pay | Admitting: Obstetrics & Gynecology

## 2018-02-07 NOTE — Patient Instructions (Signed)
1. Encounter for routine gynecological examination with Papanicolaou smear of cervix Normal gynecologic exam.  Pap test with high risk HPV done today.  Breast exam normal.  2. Encounter for surveillance of vaginal ring hormonal contraceptive device Well on NuvaRing.  No contraindication to continue.  NuvaRing represcribed.  3. Breast lump in female Bilateral increase in density at the mid internal quadrant of the breasts.  Probable fibrocystic breast at those location.  We will proceed with a bilateral diagnostic mammogram and breast ultrasound.  4. Screen for STD (sexually transmitted disease) Recommend continuing with strict condom use. - Gono-Chlam done - HIV antibody (with reflex) - RPR - Hepatitis C Antibody - Hepatitis B Surface AntiGEN  5. Vulvar ulcer Probable recurrence of genital herpes.  Sure swab HSV done. - SureSwab HSV, Type 1/2 DNA, PCR  6. Class 3 severe obesity due to excess calories without serious comorbidity with body mass index (BMI) of 40.0 to 44.9 in adult [facility]) Obesity with body mass index at 41.48.  Lost 48 pounds since last year.  Continue with low calorie diet such as [employer].  Recommend aerobic physical activities 5 times a week and weightlifting every 2 days.  Other orders - etonogestrel-ethinyl estradiol (NUVARING) 0.12-0.015 MG/24HR vaginal ring; Place 1 each vaginally every 28 (twenty-eight) days. Insert vaginally and leave in place for 3 consecutive weeks, then remove for 1 week.  Nashali, it was a pleasure seeing you today!  I will inform you of your results as soon as they are available.

## 2018-02-08 LAB — RPR: RPR: NONREACTIVE

## 2018-02-08 LAB — HEPATITIS C ANTIBODY
Hepatitis C Ab: NONREACTIVE
SIGNAL TO CUT-OFF: 0.07 (ref <1.00)

## 2018-02-08 LAB — SURESWAB HSV, TYPE 1/2 DNA, PCR
HSV 1 DNA: NOT DETECTED
HSV 2 DNA: NOT DETECTED

## 2018-02-08 LAB — HEPATITIS B SURFACE ANTIGEN: HEP B S AG: NONREACTIVE

## 2018-02-08 LAB — HIV ANTIBODY (ROUTINE TESTING W REFLEX): HIV 1&2 Ab, 4th Generation: NONREACTIVE

## 2018-02-09 LAB — PAP IG, CT-NG NAA, HPV HIGH-RISK
C. TRACHOMATIS RNA, TMA: NOT DETECTED
HPV DNA High Risk: NOT DETECTED
N. gonorrhoeae RNA, TMA: NOT DETECTED

## 2018-02-12 ENCOUNTER — Ambulatory Visit
Admission: RE | Admit: 2018-02-12 | Discharge: 2018-02-12 | Disposition: A | Discharge status: Home / Self Care | Payer: PRIVATE HEALTH INSURANCE | Source: Ambulatory Visit | Attending: Obstetrics & Gynecology | Admitting: Obstetrics & Gynecology

## 2018-02-12 ENCOUNTER — Ambulatory Visit
Admission: RE | Admit: 2018-02-12 | Discharge: 2018-02-12 | Disposition: A | Discharge status: Home / Self Care | Payer: Managed Care, Other (non HMO) | Source: Ambulatory Visit | Attending: Obstetrics & Gynecology | Admitting: Obstetrics & Gynecology

## 2018-02-12 DIAGNOSIS — N63 Unspecified lump in unspecified breast: Secondary | ICD-10-CM

## 2018-03-29 ENCOUNTER — Ambulatory Visit: Payer: Managed Care, Other (non HMO) | Admitting: Obstetrics & Gynecology

## 2018-03-29 ENCOUNTER — Encounter: Payer: Self-pay | Admitting: Obstetrics & Gynecology

## 2018-03-29 VITALS — BP 128/80

## 2018-03-29 DIAGNOSIS — Z975 Presence of (intrauterine) contraceptive device: Secondary | ICD-10-CM | POA: Diagnosis not present

## 2018-03-29 DIAGNOSIS — N921 Excessive and frequent menstruation with irregular cycle: Secondary | ICD-10-CM | POA: Diagnosis not present

## 2018-03-29 DIAGNOSIS — R1032 Left lower quadrant pain: Secondary | ICD-10-CM | POA: Diagnosis not present

## 2018-03-29 NOTE — Progress Notes (Signed)
    Patricia Giles [birth year] [id number]        37 y.o.  [id number] Divorcing  RP: BTB with LLQ pain on Nuvaring  HPI: New partner since early December 2019.  Has been bleeding on NuvaRing since January 1 with mild discomfort in the left lower quadrant of the abdomen.  No abnormal vaginal discharge.  No fever.  Full STD work-up was -February 05, 2018.  Patient declines repeating the whole work-up but would like gonorrhea and chlamydia to be tested.   OB History  Gravida Para Term Preterm AB Living  3 1     2 1   SAB TAB Ectopic Multiple Live Births  1 1          # Outcome Date GA Lbr Len/2nd Weight Sex Delivery Anes PTL Lv  3 TAB           2 SAB           1 Para             Past medical history,surgical history, problem list, medications, allergies, family history and social history were all reviewed and documented in the EPIC chart.   Directed ROS with pertinent positives and negatives documented in the history of present illness/assessment and plan.  Exam:  Vitals:   03/29/18 1427  BP: 128/80   General appearance:  Normal  Abdomen: Normal  Gynecologic exam: Vulva normal.  Speculum: Cervix and vagina normal.  Normal vaginal secretions.  Gonorrhea and Chlamydia testing done.  Bimanual exam: Uterus anteverted, mobile, nontender, normal volume.  No adnexal mass felt, mildly tender on the left side.   Assessment/Plan:  38 y.o. [id number]   1. Breakthrough bleeding with NuvaRing Breakthrough bleeding on NuvaRing.  Rule out gonorrhea and chlamydia.  Follow-up with a pelvic ultrasound to rule out endometrial pathology such as polyp, fibroid, endometrial hyperplasia and endometrial cancer. - US Transvaginal Non-OB; Future - C. trachomatis/N. gonorrhoeae RNA  2. LLQ abdominal pain As above, will rule out gonorrhea and chlamydia and patient will follow-up with a pelvic ultrasound. - US Transvaginal Non-OB; Future - C. trachomatis/N. gonorrhoeae RNA  Counseling on above issues and  coordination of care more than 50% for 25 minutes.  [name], 2:43 PM 03/29/2018

## 2018-03-30 LAB — C. TRACHOMATIS/N. GONORRHOEAE RNA
C. TRACHOMATIS RNA, TMA: NOT DETECTED
N. gonorrhoeae RNA, TMA: NOT DETECTED

## 2018-04-01 ENCOUNTER — Ambulatory Visit (INDEPENDENT_AMBULATORY_CARE_PROVIDER_SITE_OTHER): Payer: Managed Care, Other (non HMO)

## 2018-04-01 ENCOUNTER — Encounter: Payer: Self-pay | Admitting: *Deleted

## 2018-04-01 ENCOUNTER — Ambulatory Visit (INDEPENDENT_AMBULATORY_CARE_PROVIDER_SITE_OTHER): Payer: Managed Care, Other (non HMO) | Admitting: Obstetrics & Gynecology

## 2018-04-01 DIAGNOSIS — Z30017 Encounter for initial prescription of implantable subdermal contraceptive: Secondary | ICD-10-CM | POA: Diagnosis not present

## 2018-04-01 DIAGNOSIS — N921 Excessive and frequent menstruation with irregular cycle: Secondary | ICD-10-CM | POA: Diagnosis not present

## 2018-04-01 DIAGNOSIS — Z975 Presence of (intrauterine) contraceptive device: Secondary | ICD-10-CM

## 2018-04-01 DIAGNOSIS — R1032 Left lower quadrant pain: Secondary | ICD-10-CM

## 2018-04-01 DIAGNOSIS — N840 Polyp of corpus uteri: Secondary | ICD-10-CM

## 2018-04-01 NOTE — Progress Notes (Signed)
    Patricia Giles 1980/09/14 [id number]        37 y.o.  [id number] Divorcing  RP: BTB and LLQ pain on Nuvaring for a Pelvic US  HPI:  New partner x 01/2018, STI screen negative 02/05/2018.  BTB on Nuvaring x 03/03/2018 and mild LLQ pain/discomfort.  Gono-Chlam neg 03/29/2018.   OB History  Gravida Para Term Preterm AB Living  3 1     2 1   SAB TAB Ectopic Multiple Live Births  1 1          # Outcome Date GA Lbr Len/2nd Weight Sex Delivery Anes PTL Lv  3 TAB           2 SAB           1 Para             Past medical history,surgical history, problem list, medications, allergies, family history and social history were all reviewed and documented in the EPIC chart.   Directed ROS with pertinent positives and negatives documented in the history of present illness/assessment and plan.  Exam:  There were no vitals filed for this visit. General appearance:  Normal  Pelvic US today: T/V images.  Anteverted uterus measuring 8.02 x 5.65 x 4.11 cm.  Subserosal fibroid at the right fundus measuring 1.7 x 1.6 cm.  Small amount of fluid in the uterine cavity outlines and echogenic intracavitary mass measuring 7 x 7 mm with a feeder vessel identified anteriorly.  Ovaries normal bilaterally.  No adnexal mass.  No free fluid in the posterior cul de sac.   Assessment/Plan:  38 y.o. [id number]   1. Metrorrhagia Pelvic ultrasound findings reviewed thoroughly with patient.  Uterus is normal size with a small subserosal fibroid measuring 1.7 cm.  A small amount of fluid is present in the intrauterine cavity which is outlining an intra-uterine lesion measuring 7 x 7 mm with a feeder vessel compatible with an endometrial Polyp.  Ovaries are normal bilaterally and no adnexal masses seen.  There is no free fluid in the posterior cul-de-sac.  Given that the intrauterine lesion was well seen on pelvic ultrasound, no need to perform a sonohysterogram.  We will proceed with hysteroscopy with MyoSure excision of the  probable on the endometrial polyp and a D&C.  Information and pamphlet given on hysteroscopy.  Follow-up preop visit.  2. Endometrial polyp As above, will schedule schedule HSC Myosure Excision, D+C.  Preop, procedure and postop management reviewed with patient.  Follow-up preop visit.  3. Encounter for initial prescription of implantable subdermal contraceptive Contraceptive counseling done.  Decision to use Nexplanon.  Follow-up Nexplanon insertion.  Counseling on above issues and coordination of care more than 50% for 15 minutes.  [name], 3:53 PM 04/01/2018

## 2018-04-02 ENCOUNTER — Telehealth: Payer: Self-pay

## 2018-04-02 NOTE — Telephone Encounter (Signed)
I called patient to discuss scheduling surgery. We discussed her ins benefits and her estimated surgery prepymt amount due by one week prior to surgery.  I will mail her a Ambulance person. I scheduled her for 04/21/18 at 9:00am at [facility].  I will mail her a pamphlet from [facility].  Pre op appt with ML was scheduled for 04/12/28 at 11:30am.

## 2018-04-02 NOTE — Telephone Encounter (Signed)
Claudia scheduled pre op appt for patient but patient told her that Dr. Marguerita Merles planned to insert Nexplanon at pre op visit. Rosemarie Ax said that they usually schedule it with the patient's cycle.    I checked with Dr. Marguerita Merles and she wrote "Can insert Nexplanon with protected intercourse or abstinence x 2 weeks and neg UPT, if not on menses."  I spoke with patient and advised her. She indicated this would be no problem. Appt scheduled.

## 2018-04-04 ENCOUNTER — Encounter: Payer: Self-pay | Admitting: Obstetrics & Gynecology

## 2018-04-04 NOTE — Patient Instructions (Signed)
1. Breakthrough bleeding with NuvaRing Breakthrough bleeding on NuvaRing.  Rule out gonorrhea and chlamydia.  Follow-up with a pelvic ultrasound to rule out endometrial pathology such as polyp, fibroid, endometrial hyperplasia and endometrial cancer. - US Transvaginal Non-OB; Future - C. trachomatis/N. gonorrhoeae RNA  2. LLQ abdominal pain As above, will rule out gonorrhea and chlamydia and patient will follow-up with a pelvic ultrasound. - US Transvaginal Non-OB; Future - C. trachomatis/N. gonorrhoeae RNA  Patricia Giles, it was a pleasure seeing you today!  I will inform you of your results as soon as they are available.

## 2018-04-05 ENCOUNTER — Encounter: Payer: Self-pay | Admitting: Anesthesiology

## 2018-04-08 ENCOUNTER — Encounter: Payer: Self-pay | Admitting: Obstetrics & Gynecology

## 2018-04-08 NOTE — Patient Instructions (Signed)
1. Metrorrhagia Pelvic ultrasound findings reviewed thoroughly with patient.  Uterus is normal size with a small subserosal fibroid measuring 1.7 cm.  A small amount of fluid is present in the intrauterine cavity which is outlining an intra-uterine lesion measuring 7 x 7 mm with a feeder vessel compatible with an endometrial Polyp.  Ovaries are normal bilaterally and no adnexal masses seen.  There is no free fluid in the posterior cul-de-sac.  Given that the intrauterine lesion was well seen on pelvic ultrasound, no need to perform a sonohysterogram.  We will proceed with hysteroscopy with MyoSure excision of the probable on the endometrial polyp and a D&C.  Information and pamphlet given on hysteroscopy.  Follow-up preop visit.  2. Endometrial polyp As above, will schedule schedule HSC Myosure Excision, D+C.  Preop, procedure and postop management reviewed with patient.  Follow-up preop visit.  3. Encounter for initial prescription of implantable subdermal contraceptive Contraceptive counseling done.  Decision to use Nexplanon.  Follow-up Nexplanon insertion.  Patricia Giles, it was a pleasure seeing you today!

## 2018-04-12 ENCOUNTER — Ambulatory Visit (INDEPENDENT_AMBULATORY_CARE_PROVIDER_SITE_OTHER): Payer: Managed Care, Other (non HMO) | Admitting: Obstetrics & Gynecology

## 2018-04-12 ENCOUNTER — Encounter: Payer: Self-pay | Admitting: Obstetrics & Gynecology

## 2018-04-12 VITALS — BP 142/90

## 2018-04-12 DIAGNOSIS — N840 Polyp of corpus uteri: Secondary | ICD-10-CM | POA: Diagnosis not present

## 2018-04-12 DIAGNOSIS — Z30017 Encounter for initial prescription of implantable subdermal contraceptive: Secondary | ICD-10-CM | POA: Diagnosis not present

## 2018-04-12 DIAGNOSIS — Z01818 Encounter for other preprocedural examination: Secondary | ICD-10-CM

## 2018-04-12 NOTE — Progress Notes (Signed)
Caidence Kaseman 1981-02-05 [id number]        38 y.o.  [id number]   RP: Nexplanon insertion and preop hysteroscopy excision of polyp and D&C  HPI: Breakthrough bleeding on NuvaRing.  Decision to change to Nexplanon with insertion today.  Not sexually active x1 month.  No pelvic pain.  Last visit April 01, 2018 a pelvic ultrasound revealed an intrauterine lesion compatible with an endometrial polyp.   OB History  Gravida Para Term Preterm AB Living  3 1     2 1   SAB TAB Ectopic Multiple Live Births  1 1          # Outcome Date GA Lbr Len/2nd Weight Sex Delivery Anes PTL Lv  3 TAB           2 SAB           1 Para             Past medical history,surgical history, problem list, medications, allergies, family history and social history were all reviewed and documented in the EPIC chart.   Directed ROS with pertinent positives and negatives documented in the history of present illness/assessment and plan.  Exam:  Vitals:   04/12/18 1204  BP: (!) 142/90   General appearance:  Normal                                              Nexplanon Procedure Note (insertion)   The patient was laying on her back with her nondominant arm flexed at the elbow and externally rotated. The insertion site was identified as the underside of the nondominant upper arm approximately 8 cm from the medial epicondyle of the humerus.  A mark was made with a sterile marker at the spot where the Nexplanon implant will be inserted. The area was cleansed with Betadine solution. The area was anesthetized with 1% lidocaine  (1 cc)  at the area the injection site and underneath the skin along the planned insertion tunnel. The preloaded disposable Nexplanon was removed from its sterile casing.  The applicator was held above the needle at the textured surface area. The transparent protector was removed. With a freehand, the skin was stretched around the insertion site with a thumb and index finger. The skin was then  punctured with the tip of the needle angled at 30. The Nexplanon applicator was lowered to a horizontal position. While lifting the skin with the tip of the needle the needle was then slid to its full length. The applicator was kept in this position with the needle inserted to its full length. The purple slider was unlocked by pushing it slightly downward. The slider was fully moved back until it stopped. This allowed the implant to be in the final subdermal position and the needle to be locked inside the body of the applicator. The applicator was then removed. 3 Steri-Strips were applied over the incision, a band-aid and a bandage was placed which the patient is to remove tomorrow. No complications, the patient tolerated procedure well and was released home with instructions.  Pelvic US 04/01/2018:T/V images.  Anteverted uterus measuring 8.02 x 5.65 x 4.11 cm.  Subserosal fibroid at the right fundus measuring 1.7 x 1.6 cm.  Small amount of fluid in the uterine cavity outlines and echogenic intracavitary mass measuring 7 x 7 mm with a feeder vessel identified  anteriorly.  Ovaries normal bilaterally.  No adnexal mass.  No free fluid in the posterior cul de sac.   Assessment/Plan:  38 y.o. [id number]   1. Endometrial polyp Breakthrough bleeding on NuvaRing.  Pelvic ultrasound April 01, 2018 revealed a 7 mm intrauterine lesion compatible with an endometrial polyp. Preop for hysteroscopy with MyoSure excision of the polyp and D&C.  Preop, procedure and postop management reviewed with patient.  Surgical risks including trauma, uterine perforation especially, infection, DVT/pulmonary embolism and anesthesiology complication reviewed.  Patient voiced understanding and agreement with plan.  2. Nexplanon insertion Easy insertion of Nexplanon.  Patient will stop NuvaRing.  Precautions reviewed.                        Patient was counseled as to the risk of surgery to include the following:  1. Infection  (prohylactic antibiotics will be administered)  2. DVT/Pulmonary Embolism (prophylactic pneumo compression stockings will be used)  3.Trauma to internal organs requiring additional surgical procedure to repair any injury to internal organs requiring perhaps additional hospitalization days.  4.Hemmorhage requiring transfusion and blood products which carry risks such as anaphylactic reaction, hepatitis and AIDS  Patient had received literature information on the procedure scheduled and all her questions were answered and fully accepts all risks.   Counseling on above issues and coordination of care more than 50% for 15 minutes.  [name], 12:23 PM 04/12/2018

## 2018-04-12 NOTE — Patient Instructions (Signed)
1. Endometrial polyp Breakthrough bleeding on NuvaRing.  Pelvic ultrasound April 01, 2018 revealed a 7 mm intrauterine lesion compatible with an endometrial polyp. Preop for hysteroscopy with MyoSure excision of the polyp and D&C.  Preop, procedure and postop management reviewed with patient.  Surgical risks including trauma, uterine perforation especially, infection, DVT/pulmonary embolism and anesthesiology complication reviewed.  Patient voiced understanding and agreement with plan.  2. Nexplanon insertion Easy insertion of Nexplanon.  Patient will stop NuvaRing.  Precautions reviewed.                        Patient was counseled as to the risk of surgery to include the following:  1. Infection (prohylactic antibiotics will be administered)  2. DVT/Pulmonary Embolism (prophylactic pneumo compression stockings will be used)  3.Trauma to internal organs requiring additional surgical procedure to repair any injury to internal organs requiring perhaps additional hospitalization days.  4.Hemmorhage requiring transfusion and blood products which carry risks such as anaphylactic reaction, hepatitis and AIDS  Patient had received literature information on the procedure scheduled and all her questions were answered and fully accepts all risks.   Evalie, it was a pleasure seeing you today!

## 2018-04-19 ENCOUNTER — Encounter (HOSPITAL_BASED_OUTPATIENT_CLINIC_OR_DEPARTMENT_OTHER): Payer: Self-pay | Admitting: *Deleted

## 2018-04-19 ENCOUNTER — Other Ambulatory Visit: Payer: Self-pay

## 2018-04-19 NOTE — Progress Notes (Signed)
Spoke w/ pt via phone for pre-op interview.  Npo after mn w/ exception clear liquids until 0600 day of surgery at which time pt to completed ensure pre-surgery drink, pt verbalized understanding.  Arrive at 0700.  Needs urine preg.  Getting cbc and t&s done Tuesday 04-20-2018 @ 0800, and pick up ensure drink with printed instructions.  Pt will take am meds dos w/ sips of water and do breo inhaler.  Pt asked to bring rescue inhaler.

## 2018-04-20 ENCOUNTER — Encounter (HOSPITAL_BASED_OUTPATIENT_CLINIC_OR_DEPARTMENT_OTHER): Payer: Self-pay | Admitting: Anesthesiology

## 2018-04-20 ENCOUNTER — Encounter (HOSPITAL_COMMUNITY)
Admission: RE | Admit: 2018-04-20 | Discharge: 2018-04-20 | Disposition: A | Discharge status: Home / Self Care | Payer: Managed Care, Other (non HMO) | Source: Ambulatory Visit | Attending: Obstetrics & Gynecology | Admitting: Obstetrics & Gynecology

## 2018-04-20 DIAGNOSIS — N921 Excessive and frequent menstruation with irregular cycle: Secondary | ICD-10-CM | POA: Diagnosis not present

## 2018-04-20 DIAGNOSIS — J45909 Unspecified asthma, uncomplicated: Secondary | ICD-10-CM | POA: Diagnosis not present

## 2018-04-20 DIAGNOSIS — Z7951 Long term (current) use of inhaled steroids: Secondary | ICD-10-CM | POA: Diagnosis not present

## 2018-04-20 DIAGNOSIS — K219 Gastro-esophageal reflux disease without esophagitis: Secondary | ICD-10-CM | POA: Diagnosis not present

## 2018-04-20 DIAGNOSIS — Z888 Allergy status to other drugs, medicaments and biological substances status: Secondary | ICD-10-CM | POA: Diagnosis not present

## 2018-04-20 DIAGNOSIS — Z793 Long term (current) use of hormonal contraceptives: Secondary | ICD-10-CM | POA: Diagnosis not present

## 2018-04-20 DIAGNOSIS — Z9884 Bariatric surgery status: Secondary | ICD-10-CM | POA: Diagnosis not present

## 2018-04-20 DIAGNOSIS — F419 Anxiety disorder, unspecified: Secondary | ICD-10-CM | POA: Diagnosis not present

## 2018-04-20 DIAGNOSIS — Z01812 Encounter for preprocedural laboratory examination: Secondary | ICD-10-CM

## 2018-04-20 DIAGNOSIS — Z79899 Other long term (current) drug therapy: Secondary | ICD-10-CM | POA: Diagnosis not present

## 2018-04-20 DIAGNOSIS — N840 Polyp of corpus uteri: Secondary | ICD-10-CM | POA: Diagnosis not present

## 2018-04-20 DIAGNOSIS — Z6839 Body mass index (BMI) 39.0-39.9, adult: Secondary | ICD-10-CM | POA: Diagnosis not present

## 2018-04-20 LAB — CBC
HCT: 37.3 % (ref 36.0–46.0)
Hemoglobin: 11.9 g/dL — ABNORMAL LOW (ref 12.0–15.0)
MCH: 29.6 pg (ref 26.0–34.0)
MCHC: 31.9 g/dL (ref 30.0–36.0)
MCV: 92.8 fL (ref 80.0–100.0)
Platelets: 274 10*3/uL (ref 150–400)
RBC: 4.02 MIL/uL (ref 3.87–5.11)
RDW: 13.2 % (ref 11.5–15.5)
WBC: 8.6 10*3/uL (ref 4.0–10.5)
nRBC: 0 % (ref 0.0–0.2)

## 2018-04-20 LAB — ABO/RH: ABO/RH(D): O POS

## 2018-04-20 NOTE — Anesthesia Preprocedure Evaluation (Addendum)
Anesthesia Evaluation  Patient identified by MRN, date of birth, ID band Patient awake    Reviewed: Allergy & Precautions, NPO status , Patient's Chart, lab work & pertinent test results  Airway Mallampati: I  TM Distance: >3 FB Neck ROM: Full    Dental no notable dental hx. (+) Teeth Intact   Pulmonary asthma ,    Pulmonary exam normal breath sounds clear to auscultation       Cardiovascular Normal cardiovascular exam+ Valvular Problems/Murmurs  Rhythm:Regular Rate:Normal     Neuro/Psych  Headaches,    GI/Hepatic Neg liver ROS, GERD  Medicated and Controlled,S/P Gastric sleeve   Endo/Other  Morbid obesity  Renal/GU negative Renal ROS  negative genitourinary   Musculoskeletal negative musculoskeletal ROS (+)   Abdominal   Peds  Hematology  (+) anemia ,   Anesthesia Other Findings   Reproductive/Obstetrics Endometrial polyp Metorrhagia                            Anesthesia Physical Anesthesia Plan  ASA: III  Anesthesia Plan: General   Post-op Pain Management:    Induction: Intravenous  PONV Risk Score and Plan: 4 or greater and Scopolamine patch - Pre-op, Midazolam, Ondansetron, Treatment may vary due to age or medical condition and Dexamethasone  Airway Management Planned: LMA  Additional Equipment:   Intra-op Plan:   Post-operative Plan: Extubation in OR  Informed Consent: I have reviewed the patients History and Physical, chart, labs and discussed the procedure including the risks, benefits and alternatives for the proposed anesthesia with the patient or authorized representative who has indicated his/her understanding and acceptance.     Dental advisory given  Plan Discussed with: CRNA and Surgeon  Anesthesia Plan Comments:        Anesthesia Quick Evaluation

## 2018-04-21 ENCOUNTER — Ambulatory Visit (HOSPITAL_BASED_OUTPATIENT_CLINIC_OR_DEPARTMENT_OTHER): Payer: Managed Care, Other (non HMO) | Admitting: Anesthesiology

## 2018-04-21 ENCOUNTER — Other Ambulatory Visit: Payer: Self-pay

## 2018-04-21 ENCOUNTER — Encounter (HOSPITAL_BASED_OUTPATIENT_CLINIC_OR_DEPARTMENT_OTHER): Payer: Self-pay | Admitting: Anesthesiology

## 2018-04-21 ENCOUNTER — Ambulatory Visit (HOSPITAL_BASED_OUTPATIENT_CLINIC_OR_DEPARTMENT_OTHER)
Admission: RE | Admit: 2018-04-21 | Discharge: 2018-04-21 | Disposition: A | Discharge status: Home / Self Care | Payer: Managed Care, Other (non HMO) | Attending: Obstetrics & Gynecology | Admitting: Obstetrics & Gynecology

## 2018-04-21 ENCOUNTER — Encounter (HOSPITAL_BASED_OUTPATIENT_CLINIC_OR_DEPARTMENT_OTHER)
Admission: RE | Disposition: A | Discharge status: Home / Self Care | Payer: Self-pay | Source: Home / Self Care | Attending: Obstetrics & Gynecology

## 2018-04-21 DIAGNOSIS — Z888 Allergy status to other drugs, medicaments and biological substances status: Secondary | ICD-10-CM | POA: Insufficient documentation

## 2018-04-21 DIAGNOSIS — Z6839 Body mass index (BMI) 39.0-39.9, adult: Secondary | ICD-10-CM | POA: Insufficient documentation

## 2018-04-21 DIAGNOSIS — N921 Excessive and frequent menstruation with irregular cycle: Secondary | ICD-10-CM | POA: Insufficient documentation

## 2018-04-21 DIAGNOSIS — Z7951 Long term (current) use of inhaled steroids: Secondary | ICD-10-CM | POA: Insufficient documentation

## 2018-04-21 DIAGNOSIS — N840 Polyp of corpus uteri: Secondary | ICD-10-CM | POA: Insufficient documentation

## 2018-04-21 DIAGNOSIS — Z793 Long term (current) use of hormonal contraceptives: Secondary | ICD-10-CM | POA: Insufficient documentation

## 2018-04-21 DIAGNOSIS — K219 Gastro-esophageal reflux disease without esophagitis: Secondary | ICD-10-CM | POA: Insufficient documentation

## 2018-04-21 DIAGNOSIS — J45909 Unspecified asthma, uncomplicated: Secondary | ICD-10-CM | POA: Insufficient documentation

## 2018-04-21 DIAGNOSIS — F419 Anxiety disorder, unspecified: Secondary | ICD-10-CM | POA: Insufficient documentation

## 2018-04-21 DIAGNOSIS — Z9884 Bariatric surgery status: Secondary | ICD-10-CM | POA: Insufficient documentation

## 2018-04-21 DIAGNOSIS — Z79899 Other long term (current) drug therapy: Secondary | ICD-10-CM | POA: Insufficient documentation

## 2018-04-21 HISTORY — DX: Excessive and frequent menstruation with irregular cycle: N92.1

## 2018-04-21 HISTORY — DX: Cardiac murmur, unspecified: R01.1

## 2018-04-21 HISTORY — DX: Arthralgia of temporomandibular joint, unspecified side: M26.629

## 2018-04-21 HISTORY — DX: Unspecified asthma, uncomplicated: J45.909

## 2018-04-21 HISTORY — DX: Gastro-esophageal reflux disease without esophagitis: K21.9

## 2018-04-21 HISTORY — PX: DILATATION & CURETTAGE/HYSTEROSCOPY WITH MYOSURE: SHX6511

## 2018-04-21 HISTORY — DX: Polyp of corpus uteri: N84.0

## 2018-04-21 HISTORY — DX: Bariatric surgery status: Z98.84

## 2018-04-21 HISTORY — DX: Iron deficiency anemia, unspecified: D50.9

## 2018-04-21 HISTORY — DX: Presence of spectacles and contact lenses: Z97.3

## 2018-04-21 LAB — TYPE AND SCREEN
ABO/RH(D): O POS
Antibody Screen: NEGATIVE

## 2018-04-21 LAB — POCT PREGNANCY, URINE: Preg Test, Ur: NEGATIVE

## 2018-04-21 SURGERY — DILATATION & CURETTAGE/HYSTEROSCOPY WITH MYOSURE
Anesthesia: General | Site: Vagina

## 2018-04-21 MED ORDER — LACTATED RINGERS IV SOLN
INTRAVENOUS | Status: DC
  Administered 2018-04-21: 125 mL/h via INTRAVENOUS
  Filled 2018-04-21: qty 1000

## 2018-04-21 MED ORDER — PROPOFOL 10 MG/ML IV BOLUS
INTRAVENOUS | Status: DC | PRN
  Administered 2018-04-21: 200 mg via INTRAVENOUS
  Administered 2018-04-21: 100 mg via INTRAVENOUS

## 2018-04-21 MED ORDER — LIDOCAINE 2% (20 MG/ML) 5 ML SYRINGE
INTRAMUSCULAR | Status: AC
  Filled 2018-04-21: qty 5

## 2018-04-21 MED ORDER — DEXAMETHASONE SODIUM PHOSPHATE 10 MG/ML IJ SOLN
INTRAMUSCULAR | Status: DC | PRN
  Administered 2018-04-21: 10 mg via INTRAVENOUS

## 2018-04-21 MED ORDER — FENTANYL CITRATE (PF) 100 MCG/2ML IJ SOLN
INTRAMUSCULAR | Status: DC | PRN
  Administered 2018-04-21: 50 ug via INTRAVENOUS

## 2018-04-21 MED ORDER — KETOROLAC TROMETHAMINE 30 MG/ML IJ SOLN
INTRAMUSCULAR | Status: AC
  Filled 2018-04-21: qty 1

## 2018-04-21 MED ORDER — DEXAMETHASONE SODIUM PHOSPHATE 10 MG/ML IJ SOLN
INTRAMUSCULAR | Status: AC
  Filled 2018-04-21: qty 1

## 2018-04-21 MED ORDER — MIDAZOLAM HCL 2 MG/2ML IJ SOLN
INTRAMUSCULAR | Status: AC
  Filled 2018-04-21: qty 2

## 2018-04-21 MED ORDER — LIDOCAINE 2% (20 MG/ML) 5 ML SYRINGE
INTRAMUSCULAR | Status: DC | PRN
  Administered 2018-04-21: 100 mg via INTRAVENOUS

## 2018-04-21 MED ORDER — LIDOCAINE HCL 1 % IJ SOLN
INTRAMUSCULAR | Status: DC | PRN
  Administered 2018-04-21: 20 mL

## 2018-04-21 MED ORDER — LACTATED RINGERS IV SOLN
INTRAVENOUS | Status: DC
  Filled 2018-04-21: qty 1000

## 2018-04-21 MED ORDER — ONDANSETRON HCL 4 MG/2ML IJ SOLN
INTRAMUSCULAR | Status: DC | PRN
  Administered 2018-04-21: 4 mg via INTRAVENOUS

## 2018-04-21 MED ORDER — FENTANYL CITRATE (PF) 100 MCG/2ML IJ SOLN
INTRAMUSCULAR | Status: AC
  Filled 2018-04-21: qty 2

## 2018-04-21 MED ORDER — PROPOFOL 10 MG/ML IV BOLUS
INTRAVENOUS | Status: AC
  Filled 2018-04-21: qty 40

## 2018-04-21 MED ORDER — SODIUM CHLORIDE 0.9 % IR SOLN
Status: DC | PRN
  Administered 2018-04-21: 3000 mL

## 2018-04-21 MED ORDER — KETOROLAC TROMETHAMINE 30 MG/ML IJ SOLN
INTRAMUSCULAR | Status: DC | PRN
  Administered 2018-04-21: 30 mg via INTRAVENOUS

## 2018-04-21 MED ORDER — CEFAZOLIN SODIUM-DEXTROSE 2-4 GM/100ML-% IV SOLN
2.0000 g | INTRAVENOUS | Status: AC
  Administered 2018-04-21: 2 g via INTRAVENOUS
  Filled 2018-04-21: qty 100

## 2018-04-21 MED ORDER — CEFAZOLIN SODIUM-DEXTROSE 2-4 GM/100ML-% IV SOLN
INTRAVENOUS | Status: AC
  Filled 2018-04-21: qty 100

## 2018-04-21 MED ORDER — HYDROCODONE-ACETAMINOPHEN 7.5-325 MG PO TABS: 1.0000 | ORAL_TABLET | Freq: Once | ORAL | Status: DC | PRN

## 2018-04-21 MED ORDER — METOCLOPRAMIDE HCL 5 MG/ML IJ SOLN: 10.0000 mg | Freq: Once | INTRAMUSCULAR | Status: DC | PRN

## 2018-04-21 MED ORDER — MIDAZOLAM HCL 5 MG/5ML IJ SOLN
INTRAMUSCULAR | Status: DC | PRN
  Administered 2018-04-21: 2 mg via INTRAVENOUS

## 2018-04-21 MED ORDER — ONDANSETRON HCL 4 MG/2ML IJ SOLN
INTRAMUSCULAR | Status: AC
  Filled 2018-04-21: qty 2

## 2018-04-21 MED ORDER — MEPERIDINE HCL 25 MG/ML IJ SOLN: 6.2500 mg | INTRAMUSCULAR | Status: DC | PRN

## 2018-04-21 MED ORDER — FENTANYL CITRATE (PF) 100 MCG/2ML IJ SOLN: 25.0000 ug | INTRAMUSCULAR | Status: DC | PRN

## 2018-04-21 SURGICAL SUPPLY — 28 items
CANISTER SUCT 3000ML PPV (MISCELLANEOUS) ×3 IMPLANT
CATH ROBINSON RED A/P 16FR (CATHETERS) ×3 IMPLANT
COVER WAND RF STERILE (DRAPES) ×3 IMPLANT
DEVICE MYOSURE LITE (MISCELLANEOUS) ×3 IMPLANT
DEVICE MYOSURE REACH (MISCELLANEOUS) IMPLANT
DILATOR CANAL MILEX (MISCELLANEOUS) IMPLANT
ELECT REM PT RETURN 9FT ADLT (ELECTROSURGICAL)
ELECTRODE REM PT RTRN 9FT ADLT (ELECTROSURGICAL) IMPLANT
GAUZE 4X4 16PLY RFD (DISPOSABLE) ×3 IMPLANT
GLOVE BIO SURGEON STRL SZ 6.5 (GLOVE) ×2 IMPLANT
GLOVE BIO SURGEONS STRL SZ 6.5 (GLOVE) ×1
GLOVE BIOGEL PI IND STRL 7.0 (GLOVE) ×2 IMPLANT
GLOVE BIOGEL PI IND STRL 7.5 (GLOVE) ×1 IMPLANT
GLOVE BIOGEL PI IND STRL 8.5 (GLOVE) ×1 IMPLANT
GLOVE BIOGEL PI INDICATOR 7.0 (GLOVE) ×4
GLOVE BIOGEL PI INDICATOR 7.5 (GLOVE) ×2
GLOVE BIOGEL PI INDICATOR 8.5 (GLOVE) ×2
GLOVE INDICATOR 8.5 STRL (GLOVE) ×3 IMPLANT
GOWN STRL REUS W/TWL LRG LVL3 (GOWN DISPOSABLE) ×6 IMPLANT
IV NS IRRIG 3000ML ARTHROMATIC (IV SOLUTION) ×6 IMPLANT
KIT PROCEDURE FLUENT (KITS) ×3 IMPLANT
MYOSURE XL FIBROID (MISCELLANEOUS)
PACK VAGINAL MINOR WOMEN LF (CUSTOM PROCEDURE TRAY) ×3 IMPLANT
PAD OB MATERNITY 4.3X12.25 (PERSONAL CARE ITEMS) ×3 IMPLANT
PAD PREP 24X48 CUFFED NSTRL (MISCELLANEOUS) ×3 IMPLANT
SEAL ROD LENS SCOPE MYOSURE (ABLATOR) ×3 IMPLANT
SYSTEM TISS REMOVAL MYOSURE XL (MISCELLANEOUS) IMPLANT
TOWEL OR 17X26 10 PK STRL BLUE (TOWEL DISPOSABLE) ×6 IMPLANT

## 2018-04-21 NOTE — Op Note (Signed)
Operative Note  04/21/2018  9:12 AM  PATIENT:  Patricia Giles  38 y.o. female  PRE-OPERATIVE DIAGNOSIS:  Metrorrhagia, Endometrial Polyp  POST-OPERATIVE DIAGNOSIS:  Metrorrhagia, Endometrial Polyp  PROCEDURE:  Procedure(s): DILATATION & CURETTAGE/HYSTEROSCOPY WITH MYOSURE (LIGHT)  SURGEON:  Surgeon(s): [name], MD  ANESTHESIA:   general  FINDINGS: Intra-uterine cavity with a Polyp about 1 cm at the left anterior wall.  Both ostia well seen.  DESCRIPTION OF OPERATION: Under general anesthesia with endotracheal mask the patient is in lithotomy position.  She is prepped with Betadine on the suprapubic, vulvar and vaginal areas.  She is draped as usual.  The bladder is catheterized.  Timeout is done.  The vaginal exam reveals an anteverted uterus, normal volume, no adnexal mass.  The speculum is inserted in the vagina.  The anterior lip of the cervix is grasped with a tenaculum.  A paracervical block is done with lidocaine 1% a total of 20 cc at 4 and 8:00.  Dilation of the cervix with Pratt dilators up to #23 without difficulty.  The hysteroscope is inserted in the intrauterine cavity.  Inspection reveals 2 normal ostia and a small endometrial polyp at the left anterior wall measuring about 1 cm.  No other pathology in the intrauterine cavity. The Myosure light is inserted in the hysteroscope.  Complete excision of the endometrial polyp.  Pictures were taken before and after excision of the polyp.  The hysteroscope and MyoSure are removed.  We proceeded with a systematic curettage of the intra-uterine cavity on all surfaces with a small sharp curette.  Both specimens are sent together to pathology.  Removal of all instruments.  Good hemostasis.  The patient is brought to recovery room in good and stable status.  ESTIMATED BLOOD LOSS: 5 mL FLUID DEFICIT: 300 cc   Intake/Output Summary (Last 24 hours) at 04/21/2018 0912 Last data filed at 04/21/2018 0911 Gross per 24 hour  Intake -   Output 5 ml  Net -5 ml     BLOOD ADMINISTERED:none   LOCAL MEDICATIONS USED:  LIDOCAINE   SPECIMEN:  Source of Specimen:  Excision specimen of Endometrial Polyp and Endometrial Curettings  DISPOSITION OF SPECIMEN:  PATHOLOGY  COUNTS:  YES  PLAN OF CARE: Transfer to PACU  Marie-Lyne LavoieMD9:12 AM

## 2018-04-21 NOTE — Discharge Instructions (Addendum)
Hysteroscopy, Care After °This sheet gives you information about how to care for yourself after your procedure. Your health care provider may also give you more specific instructions. If you have problems or questions, contact your health care provider. °What can I expect after the procedure? °After the procedure, it is common to have: °· Cramping. °· Bleeding. This can vary from light spotting to menstrual-like bleeding. °Follow these instructions at home: °Activity °· Rest for 1-2 days after the procedure. °· Do not douche, use tampons, or have sex for 2 weeks after the procedure, or until your health care provider approves. °· Do not drive for 24 hours after the procedure, or for as long as told by your health care provider. °· Do not drive, use heavy machinery, or drink alcohol while taking prescription pain medicines. °Medicines ° °· Take over-the-counter and prescription medicines only as told by your health care provider. °· Do not take aspirin during recovery. It can increase the risk of bleeding. °General instructions °· Do not take baths, swim, or use a hot tub until your health care provider approves. Take showers instead of baths for 2 weeks, or for as long as told by your health care provider. °· To prevent or treat constipation while you are taking prescription pain medicine, your health care provider may recommend that you: °? Drink enough fluid to keep your urine clear or pale yellow. °? Take over-the-counter or prescription medicines. °? Eat foods that are high in fiber, such as fresh fruits and vegetables, whole grains, and beans. °? Limit foods that are high in fat and processed sugars, such as fried and sweet foods. °· Keep all follow-up visits as told by your health care provider. This is important. °Contact a health care provider if: °· You feel dizzy or lightheaded. °· You feel nauseous. °· You have abnormal vaginal discharge. °· You have a rash. °· You have pain that does not get better with  medicine. °· You have chills. °Get help right away if: °· You have bleeding that is heavier than a normal menstrual period. °· You have a fever. °· You have pain or cramps that get worse. °· You develop new abdominal pain. °· You faint. °· You have pain in your shoulders. °· You have shortness of breath. °Summary °· After the procedure, you may have cramping and some vaginal bleeding. °· Do not douche, use tampons, or have sex for 2 weeks after the procedure, or until your health care provider approves. °· Do not take baths, swim, or use a hot tub until your health care provider approves. Take showers instead of baths for 2 weeks, or for as long as told by your health care provider. °· Report any unusual symptoms to your health care provider. °· Keep all follow-up visits as told by your health care provider. This is important. °This information is not intended to replace advice given to you by your health care provider. Make sure you discuss any questions you have with your health care provider. °Document Released: 12/08/2012 Document Revised: 03/18/2016 Document Reviewed: 03/18/2016 °Elsevier Interactive Patient Education © 2019 [employer] ° ° ° °Post Anesthesia Home Care Instructions ° °Activity: °Get plenty of rest for the remainder of the day. A responsible individual must stay with you for 24 hours following the procedure.  °For the next 24 hours, DO NOT: °-Drive a car °-Operate machinery °-Drink alcoholic beverages °-Take any medication unless instructed by your physician °-Make any legal decisions or sign important papers. ° °  Meals: Start with liquid foods such as gelatin or soup. Progress to regular foods as tolerated. Avoid greasy, spicy, heavy foods. If nausea and/or vomiting occur, drink only clear liquids until the nausea and/or vomiting subsides. Call your physician if vomiting continues.  Special Instructions/Symptoms: Your throat may feel dry or sore from the anesthesia or the breathing tube  placed in your throat during surgery. If this causes discomfort, gargle with warm salt water. The discomfort should disappear within 24 hours.  If you had a scopolamine patch placed behind your ear for the management of post- operative nausea and/or vomiting:  1. The medication in the patch is effective for 72 hours, after which it should be removed.  Wrap patch in a tissue and discard in the trash. Wash hands thoroughly with soap and water. 2. You may remove the patch earlier than 72 hours if you experience unpleasant side effects which may include dry mouth, dizziness or visual disturbances. 3. Avoid touching the patch. Wash your hands with soap and water after contact with the patch.      Start Ibuprofen after 3:00 pm

## 2018-04-21 NOTE — Anesthesia Procedure Notes (Signed)
Procedure Name: LMA Insertion Performed by: Bonney Aid, CRNA Pre-anesthesia Checklist: Patient identified, Emergency Drugs available, Suction available and Patient being monitored Patient Re-evaluated:Patient Re-evaluated prior to induction Oxygen Delivery Method: Circle System Utilized Preoxygenation: Pre-oxygenation with 100% oxygen Induction Type: IV induction Ventilation: Mask ventilation without difficulty LMA: LMA with gastric port inserted LMA Size: 5.0 Number of attempts: 1 Placement Confirmation: positive ETCO2 Tube secured with: Tape Dental Injury: Teeth and Oropharynx as per pre-operative assessment

## 2018-04-21 NOTE — Anesthesia Postprocedure Evaluation (Signed)
Anesthesia Post Note  Patient: Nieve Rojero  Procedure(s) Performed: DILATATION & CURETTAGE/HYSTEROSCOPY WITH MYOSURE (LIGHT) (N/A Vagina )     Patient location during evaluation: PACU Anesthesia Type: General Level of consciousness: awake and alert and oriented Pain management: pain level controlled Vital Signs Assessment: post-procedure vital signs reviewed and stable Respiratory status: spontaneous breathing, nonlabored ventilation and respiratory function stable Cardiovascular status: blood pressure returned to baseline and stable Postop Assessment: no apparent nausea or vomiting Anesthetic complications: no    Last Vitals:  Vitals:   04/21/18 0930 04/21/18 0950  BP:  135/83  Pulse: 92 77  Resp: (!) 22 20  Temp:  36.8 C  SpO2: 100% 100%    Last Pain:  Vitals:   04/21/18 0950  TempSrc:   PainSc: 0-No pain                 Anurag Scarfo A.

## 2018-04-21 NOTE — H&P (Signed)
Patricia Giles is an 38 y.o. female. [id number]   RP: Hysteroscopy Myosure excision of polyp and D&C  HPI: Breakthrough bleeding on NuvaRing.  Decision to change to Nexplanon with insertion 04/12/2018.  Not sexually active x1 month.  No pelvic pain.  On April 01, 2018 a pelvic ultrasound revealed an intrauterine lesion compatible with an endometrial polyp.  Pertinent Gynecological History: Menses: Menometrorrhagia Contraception: Nexplanon Blood transfusions: none Sexually transmitted diseases: no past history Last pap: normal  OB History: G3, P1,A2,L1   Menstrual History: Patient's last menstrual period was 03/14/2018.    Past Medical History:  Diagnosis Date  . Anxiety   . Depression   . Endometrial polyp   . GERD (gastroesophageal reflux disease)   . Heart murmur    per pt had stress echo done 12/ 2018 was told normal no follow-up needed  . IDA (iron deficiency anemia)   . Metrorrhagia   . Mild asthma   . 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 Seasonal allergies   . TMJ syndrome    bilateral  . Wears glasses     Past Surgical History:  Procedure Laterality Date  . CESAREAN SECTION  2012  . LAPAROSCOPIC GASTRIC SLEEVE RESECTION  11-11-2017  @[email or web address]   . ROBOTIC ASSISTED LAPAROSCOPIC OVARIAN CYSTECTOMY Left 01/16/2014   Procedure: ROBOTIC ASSISTED LAPAROSCOPIC OVARIAN CYSTECTOMY WITH PERITONEAL WASHINGS;  Surgeon: [name], MD;  Location: [facility];  Service: Gynecology;  Laterality: Left;  . WISDOM TOOTH EXTRACTION      Family History  Problem Relation Age of Onset  . Diverticulitis Mother   . Diverticulitis Father   . Heart attack Father   . Graves' disease Sister   . Cancer - Ovarian Maternal Grandmother   . Heart attack Maternal Grandfather   . Hyperlipidemia Maternal Grandfather   . Stroke Maternal Grandfather   . Breast cancer Neg Hx     Social History:  reports that she has never smoked. She has never used smokeless tobacco. She  reports previous alcohol use. She reports that she does not use drugs.  Allergies:  Allergies  Allergen Reactions  . Bismuth-Containing Compounds Hives and Nausea And Vomiting  . Fluviral [Flu Virus Vaccine] Other (See Comments)    Fever; Loss of voice  . Lactose Intolerance (Gi)   . Other Hives    Epidural  --  Per pt   . Adhesive [Tape] Hives  . Belviq [Lorcaserin Hcl] Palpitations    Weight loss drug    Medications Prior to Admission  Medication Sig Dispense Refill Last Dose  . albuterol (PROVENTIL HFA;VENTOLIN HFA) 108 (90 BASE) MCG/ACT inhaler Inhale 1 puff into the lungs every 6 (six) hours as needed for wheezing or shortness of breath.   Past Month at Unknown time  . BREO ELLIPTA 200-25 MCG/INH AEPB Inhale 1 puff into the lungs every morning.   6 04/21/2018 at 0620  . Calcium Carbonate (CALCIUM 600 PO) Take 2 tablets by mouth daily.   04/20/2018 at Unknown time  . cetirizine (ZYRTEC) 10 MG tablet Take 10 mg by mouth every morning.    04/21/2018 at 0600  . cholecalciferol (VITAMIN D) 1000 units tablet Take 6,000 Units by mouth daily.    04/20/2018 at Unknown time  . etonogestrel (NEXPLANON) 68 MG IMPL implant 1 each by Subdermal route once.     . Ferrous Sulfate 90 (18 Fe) MG TABS Take 1 tablet by mouth daily.   04/20/2018 at Unknown time  . levocetirizine (XYZAL) 5 MG  tablet Take 5 mg by mouth every morning.    04/21/2018 at 0600  . LORazepam (ATIVAN) 1 MG tablet Take 1 mg by mouth daily as needed for anxiety.   04/21/2018 at 0630  . montelukast (SINGULAIR) 10 MG tablet Take 10 mg by mouth every morning.    04/21/2018 at 0600  . Multiple Vitamin (MULTIVITAMIN) capsule Take 1 capsule by mouth daily.   04/20/2018 at Unknown time  . omeprazole (PRILOSEC) 20 MG capsule Take 20 mg by mouth every morning.   04/20/2018 at 0900  . ursodiol (ACTIGALL) 300 MG capsule Take 300 mg by mouth 2 (two) times daily.   04/20/2018 at Unknown time  . fluticasone (FLONASE) 50 MCG/ACT nasal spray Place 1 spray  into both nostrils daily as needed for allergies.    More than a month at Unknown time  . traMADol (ULTRAM) 50 MG tablet Take 1 tablet (50 mg total) by mouth every 6 (six) hours as needed. 20 tablet 0 More than a month at Unknown time    REVIEW OF SYSTEMS: A ROS was performed and pertinent positives and negatives are included in the history.  GENERAL: No fevers or chills. HEENT: No change in vision, no earache, sore throat or sinus congestion. NECK: No pain or stiffness. CARDIOVASCULAR: No chest pain or pressure. No palpitations. PULMONARY: No shortness of breath, cough or wheeze. GASTROINTESTINAL: No abdominal pain, nausea, vomiting or diarrhea, melena or bright red blood per rectum. GENITOURINARY: No urinary frequency, urgency, hesitancy or dysuria. MUSCULOSKELETAL: No joint or muscle pain, no back pain, no recent trauma. DERMATOLOGIC: No rash, no itching, no lesions. ENDOCRINE: No polyuria, polydipsia, no heat or cold intolerance. No recent change in weight. HEMATOLOGICAL: No anemia or easy bruising or bleeding. NEUROLOGIC: No headache, seizures, numbness, tingling or weakness. PSYCHIATRIC: No depression, no loss of interest in normal activity or change in sleep pattern.     Blood pressure 128/78, pulse 82, temperature 98.1 F (36.7 C), temperature source Oral, resp. rate 18, height 5\' 9"  (1.753 m), weight 120.8 kg, last menstrual period 03/14/2018, SpO2 100 %.  Physical Exam:  See office notes   Results for orders placed or performed during the hospital encounter of 04/20/18 (from the past 24 hour(s))  CBC     Status: Abnormal   Collection Time: 04/20/18  8:26 AM  Result Value Ref Range   WBC 8.6 4.0 - 10.5 K/uL   RBC 4.02 3.87 - 5.11 MIL/uL   Hemoglobin 11.9 (L) 12.0 - 15.0 g/dL   HCT 37.3 36.0 - 46.0 %   MCV 92.8 80.0 - 100.0 fL   MCH 29.6 26.0 - 34.0 pg   MCHC 31.9 30.0 - 36.0 g/dL   RDW 13.2 11.5 - 15.5 %   Platelets 274 150 - 400 K/uL   nRBC 0.0 0.0 - 0.2 %  Type and screen  [facility]     Status: None   Collection Time: 04/20/18  8:26 AM  Result Value Ref Range   ABO/RH(D) O POS    Antibody Screen NEG    Sample Expiration 04/24/2018    Extend sample reason      NO TRANSFUSIONS OR PREGNANCY IN THE PAST 3 MONTHS Performed at [facility], [place] [address] [place], [place] [address]   ABO/Rh     Status: None   Collection Time: 04/20/18  8:26 AM  Result Value Ref Range   ABO/RH(D)      O POS Performed at [facility], [place] [address] [place], [place] [address]    Pelvic US 04/01/2018:T/V images. Anteverted uterus measuring 8.02 x 5.65 x 4.11 cm. Subserosal fibroid at the right fundus measuring 1.7 x 1.6 cm. Small amount of fluid in the uterine cavity outlines and echogenic intracavitary mass measuring 7 x 7 mm with a feeder vessel identified anteriorly. Ovaries normal bilaterally. No adnexal mass. No free fluid in the posterior cul desac.   Assessment/Plan:  38 y.o. [id number]   1. Endometrial polyp Breakthrough bleeding on NuvaRing.  Pelvic ultrasound April 01, 2018 revealed a 7 mm intrauterine lesion compatible with an endometrial polyp. Preop for hysteroscopy with MyoSure excision of the polyp and D&C.  Preop, procedure and postop management reviewed with patient.  Surgical risks including trauma, uterine perforation especially, infection, DVT/pulmonary embolism and anesthesiology complication reviewed.  Patient voiced understanding and agreement with plan.  2. Nexplanon insertion Easy insertion of Nexplanon.  Patient will stop NuvaRing.  Precautions reviewed.                        Patient was counseled as to the risk of surgery to include the following:  1. Infection (prohylactic antibiotics will be administered)  2. DVT/Pulmonary Embolism (prophylactic pneumo compression stockings will be used)  3.Trauma to internal organs requiring additional surgical procedure to repair any injury  to internal organs requiring perhaps additional hospitalization days.  4.Hemmorhage requiring transfusion and blood products which carry risks such as anaphylactic reaction, hepatitis and AIDS  Patient had received literature information on the procedure scheduled and all her questions were answered and fully accepts all risks.  Marie-Lyne Enslee Bibbins 04/21/2018, 7:48 AM

## 2018-04-21 NOTE — Transfer of Care (Signed)
Immediate Anesthesia Transfer of Care Note  Patient: Patricia Giles  Procedure(s) Performed: DILATATION & CURETTAGE/HYSTEROSCOPY WITH MYOSURE (LIGHT) (N/A Vagina )  Patient Location: PACU  Anesthesia Type:General  Level of Consciousness: drowsy  Airway & Oxygen Therapy: Patient Spontanous Breathing and Patient connected to nasal cannula oxygen  Post-op Assessment: Report given to RN  Post vital signs: Reviewed and stable  Last Vitals:  Vitals Value Taken Time  BP 122/73 04/21/2018  9:19 AM  Temp 36.9 C 04/21/2018  9:19 AM  Pulse 101 04/21/2018  9:21 AM  Resp 23 04/21/2018  9:21 AM  SpO2 100 % 04/21/2018  9:21 AM  Vitals shown include unvalidated device data.  Last Pain:  Vitals:   04/21/18 0814  TempSrc:   PainSc: 3       Patients Stated Pain Goal: 3 (11/55/20 8022)  Complications: No apparent anesthesia complications

## 2018-04-22 ENCOUNTER — Encounter (HOSPITAL_BASED_OUTPATIENT_CLINIC_OR_DEPARTMENT_OTHER): Payer: Self-pay | Admitting: Obstetrics & Gynecology

## 2018-04-22 ENCOUNTER — Encounter: Payer: Self-pay | Admitting: Anesthesiology

## 2018-04-30 ENCOUNTER — Encounter: Payer: Self-pay | Admitting: *Deleted

## 2018-05-13 ENCOUNTER — Ambulatory Visit (INDEPENDENT_AMBULATORY_CARE_PROVIDER_SITE_OTHER): Payer: Managed Care, Other (non HMO) | Admitting: Obstetrics & Gynecology

## 2018-05-13 ENCOUNTER — Other Ambulatory Visit: Payer: Self-pay

## 2018-05-13 ENCOUNTER — Encounter: Payer: Self-pay | Admitting: Obstetrics & Gynecology

## 2018-05-13 VITALS — BP 124/78

## 2018-05-13 DIAGNOSIS — Z09 Encounter for follow-up examination after completed treatment for conditions other than malignant neoplasm: Secondary | ICD-10-CM

## 2018-05-13 NOTE — Patient Instructions (Signed)
1. Status post gynecological surgery, follow-up exam Good postop evolution without complication.  Pathology benign.  May be having vaginal spotting associated with Nexplanon which was inserted April 12, 2018.  Will observe.  Patricia Giles, it was a pleasure seeing you today!

## 2018-05-13 NOTE — Progress Notes (Signed)
    Patricia Giles 12/18/1980 [id number]        38 y.o.  [id number] divorced  RP: Postop 3 weeks of hysteroscopy MyoSure excision and D&C  HPI: Very mild spotting with cramping currently.  No abnormal discharge, no odor and no itching.  No fever.  Patient had Nexplanon insertion April 12, 2018.   OB History  Gravida Para Term Preterm AB Living  3 1     2 1   SAB TAB Ectopic Multiple Live Births  1 1          # Outcome Date GA Lbr Len/2nd Weight Sex Delivery Anes PTL Lv  3 TAB           2 SAB           1 Para             Past medical history,surgical history, problem list, medications, allergies, family history and social history were all reviewed and documented in the EPIC chart.   Directed ROS with pertinent positives and negatives documented in the history of present illness/assessment and plan.  Exam:  Vitals:   05/13/18 1215  BP: 124/78   General appearance:  Normal  Abdomen: Normal  Gynecologic exam: Vulva normal.  Speculum: Cervix and vagina normal.  Mild brown blood at the external os of the cervix.  Bimanual exam: Uterus anteverted, normal volume, nontender, mobile.  No adnexal mass felt, nontender bilaterally.  Patho 04/21/2018:   Diagnosis Endometrium, curettage, and polyp - SECRETORY ENDOMETRIUM AND BENIGN ENDOMETRIAL POLYP. - NO HYPERPLASIA OR MALIGNANCY.        Assessment/Plan:  38 y.o. [id number]   1. Status post gynecological surgery, follow-up exam Good postop evolution without complication.  Pathology benign.  May be having vaginal spotting associated with Nexplanon which was inserted April 12, 2018.  Will observe.  [name], 12:27 PM 05/13/2018

## 2018-09-13 DIAGNOSIS — F411 Generalized anxiety disorder: Secondary | ICD-10-CM | POA: Diagnosis not present

## 2018-09-13 NOTE — Telephone Encounter (Signed)
Had D&C Hyst 04/2018.

## 2018-09-15 ENCOUNTER — Other Ambulatory Visit: Payer: Self-pay | Admitting: Obstetrics & Gynecology

## 2018-09-15 DIAGNOSIS — N926 Irregular menstruation, unspecified: Secondary | ICD-10-CM

## 2018-09-16 ENCOUNTER — Other Ambulatory Visit: Payer: Self-pay | Admitting: *Deleted

## 2018-09-29 ENCOUNTER — Other Ambulatory Visit: Payer: Self-pay

## 2018-09-30 ENCOUNTER — Ambulatory Visit (INDEPENDENT_AMBULATORY_CARE_PROVIDER_SITE_OTHER): Payer: Federal, State, Local not specified - PPO

## 2018-09-30 ENCOUNTER — Ambulatory Visit: Payer: Federal, State, Local not specified - PPO | Admitting: Obstetrics & Gynecology

## 2018-09-30 ENCOUNTER — Encounter: Payer: Self-pay | Admitting: Obstetrics & Gynecology

## 2018-09-30 DIAGNOSIS — Z975 Presence of (intrauterine) contraceptive device: Secondary | ICD-10-CM

## 2018-09-30 DIAGNOSIS — N921 Excessive and frequent menstruation with irregular cycle: Secondary | ICD-10-CM

## 2018-09-30 DIAGNOSIS — N926 Irregular menstruation, unspecified: Secondary | ICD-10-CM

## 2018-09-30 NOTE — Patient Instructions (Signed)
1. Breakthrough bleeding on Nexplanon Pelvic ultrasound findings reviewed thoroughly with patient.  Reassured that her uterus is normal in size and has a very small single subserous fibroid measuring 1.9 cm, which is stable since the previous ultrasound in January 2020.  The endometrium is thin and normal at 3.8 mm.  Both ovaries are normal.  Breakthrough bleeding was probably secondary to Nexplanon inserted in February 2020.  No breakthrough bleeding since September 19, 2018.  Decision to observe.  If frequent breakthrough bleeding recurs, patient was informed of the possibility of adding a progestin per mouth intermittently to control.  Patricia Giles, it was a pleasure seeing you today!

## 2018-09-30 NOTE — Progress Notes (Signed)
    Karista Aispuro 27-Mar-1980 [id number]        38 y.o.  [id number] Divorced.  RP: BTB on Nexplanon for Pelvic US  HPI: On Nexplanon x 04/12/2018.  BTB frequently until 09/18/2018, no vaginal bleeding since then.  No pelvic pain.  [place] Myosure excision of Endometrial Polyp/D+C in 04/2018.   OB History  Gravida Para Term Preterm AB Living  3 1     2 1   SAB TAB Ectopic Multiple Live Births  1 1          # Outcome Date GA Lbr Len/2nd Weight Sex Delivery Anes PTL Lv  3 TAB           2 SAB           1 Para             Past medical history,surgical history, problem list, medications, allergies, family history and social history were all reviewed and documented in the EPIC chart.   Directed ROS with pertinent positives and negatives documented in the history of present illness/assessment and plan.  Exam:  There were no vitals filed for this visit. General appearance:  Normal  Pelvic US today: T/V and T/A images.  Uterus is normal in size at 7.2 x 4.21 x 4.22 cm.  A single subserous fibroid at the fundus is measured at 1.9 x 1.6 cm, no change since previous scan April 01, 2018.  Thin symmetrical endometrial lining measured at 3.8 mm.  Both ovaries are normal in size with normal follicular pattern.  No adnexal mass.  Small amount of free fluid in the posterior cul-de-sac.   Assessment/Plan:  38 y.o. [id number]   1. Breakthrough bleeding on Nexplanon Pelvic ultrasound findings reviewed thoroughly with patient.  Reassured that her uterus is normal in size and has a very small single subserous fibroid measuring 1.9 cm, which is stable since the previous ultrasound in January 2020.  The endometrium is thin and normal at 3.8 mm.  Both ovaries are normal.  Breakthrough bleeding was probably secondary to Nexplanon inserted in February 2020.  No breakthrough bleeding since September 19, 2018.  Decision to observe.  If frequent breakthrough bleeding recurs, patient was informed of the possibility of adding a  progestin per mouth intermittently to control.  Counseling on above issues and coordination of care more than 50% for 15 minutes.  [name], 10:13 AM 09/30/2018

## 2018-10-05 DIAGNOSIS — F411 Generalized anxiety disorder: Secondary | ICD-10-CM | POA: Diagnosis not present

## 2018-10-29 DIAGNOSIS — K912 Postsurgical malabsorption, not elsewhere classified: Secondary | ICD-10-CM | POA: Diagnosis not present

## 2018-10-29 DIAGNOSIS — Z9884 Bariatric surgery status: Secondary | ICD-10-CM | POA: Diagnosis not present

## 2018-11-11 DIAGNOSIS — K080 Exfoliation of teeth due to systemic causes: Secondary | ICD-10-CM | POA: Diagnosis not present

## 2018-11-12 DIAGNOSIS — K912 Postsurgical malabsorption, not elsewhere classified: Secondary | ICD-10-CM | POA: Diagnosis not present

## 2018-11-12 DIAGNOSIS — D508 Other iron deficiency anemias: Secondary | ICD-10-CM | POA: Diagnosis not present

## 2018-11-12 DIAGNOSIS — Z9884 Bariatric surgery status: Secondary | ICD-10-CM | POA: Diagnosis not present

## 2018-11-16 DIAGNOSIS — F411 Generalized anxiety disorder: Secondary | ICD-10-CM | POA: Diagnosis not present

## 2018-11-18 DIAGNOSIS — Z6841 Body Mass Index (BMI) 40.0 and over, adult: Secondary | ICD-10-CM | POA: Diagnosis not present

## 2018-12-02 DIAGNOSIS — F411 Generalized anxiety disorder: Secondary | ICD-10-CM | POA: Diagnosis not present

## 2018-12-13 DIAGNOSIS — F411 Generalized anxiety disorder: Secondary | ICD-10-CM | POA: Diagnosis not present

## 2018-12-17 DIAGNOSIS — Z9884 Bariatric surgery status: Secondary | ICD-10-CM | POA: Diagnosis not present

## 2018-12-30 DIAGNOSIS — F411 Generalized anxiety disorder: Secondary | ICD-10-CM | POA: Diagnosis not present

## 2019-01-05 DIAGNOSIS — R197 Diarrhea, unspecified: Secondary | ICD-10-CM | POA: Diagnosis not present

## 2019-01-05 DIAGNOSIS — J302 Other seasonal allergic rhinitis: Secondary | ICD-10-CM | POA: Diagnosis not present

## 2019-01-05 DIAGNOSIS — Z20828 Contact with and (suspected) exposure to other viral communicable diseases: Secondary | ICD-10-CM | POA: Diagnosis not present

## 2019-01-21 DIAGNOSIS — J3089 Other allergic rhinitis: Secondary | ICD-10-CM | POA: Diagnosis not present

## 2019-01-21 DIAGNOSIS — H1045 Other chronic allergic conjunctivitis: Secondary | ICD-10-CM | POA: Diagnosis not present

## 2019-01-21 DIAGNOSIS — J301 Allergic rhinitis due to pollen: Secondary | ICD-10-CM | POA: Diagnosis not present

## 2019-01-21 DIAGNOSIS — J4530 Mild persistent asthma, uncomplicated: Secondary | ICD-10-CM | POA: Diagnosis not present

## 2019-01-24 DIAGNOSIS — F411 Generalized anxiety disorder: Secondary | ICD-10-CM | POA: Diagnosis not present

## 2019-02-07 ENCOUNTER — Other Ambulatory Visit: Payer: Self-pay

## 2019-02-08 ENCOUNTER — Encounter: Payer: Self-pay | Admitting: Obstetrics & Gynecology

## 2019-02-08 ENCOUNTER — Ambulatory Visit (INDEPENDENT_AMBULATORY_CARE_PROVIDER_SITE_OTHER): Payer: Federal, State, Local not specified - PPO | Admitting: Obstetrics & Gynecology

## 2019-02-08 VITALS — BP 120/80 | Ht 69.0 in | Wt 235.0 lb

## 2019-02-08 DIAGNOSIS — E6609 Other obesity due to excess calories: Secondary | ICD-10-CM

## 2019-02-08 DIAGNOSIS — Z3046 Encounter for surveillance of implantable subdermal contraceptive: Secondary | ICD-10-CM

## 2019-02-08 DIAGNOSIS — Z6834 Body mass index (BMI) 34.0-34.9, adult: Secondary | ICD-10-CM

## 2019-02-08 DIAGNOSIS — Z01419 Encounter for gynecological examination (general) (routine) without abnormal findings: Secondary | ICD-10-CM | POA: Diagnosis not present

## 2019-02-08 NOTE — Patient Instructions (Signed)
1. Encounter for routine gynecological examination with Papanicolaou smear of cervix Normal gynecologic exam.  Pap reflex done today.  Breast exam normal.  Screening mammogram 2019 was negative, next screening mammogram at age 38.  2. Encounter for surveillance of implantable subdermal contraceptive Well on Nexplanon since February 2020.  3. Class 1 obesity due to excess calories without serious comorbidity with body mass index (BMI) of 34.0 to 34.9 in adult Successfully losing weight for the last 2 years.  Lost 50 pounds since last year on a low calorie/carb diet and aerobic activities/weights every day.  Patricia Giles, it was a pleasure seeing you today!  I will inform you of your results as soon as they are available.

## 2019-02-08 NOTE — Addendum Note (Signed)
Addended by: Thurnell Garbe A on: 02/08/2019 10:35 AM   Modules accepted: Orders

## 2019-02-08 NOTE — Progress Notes (Signed)
Patricia Giles 1980/07/07 [id number]   History:    38 y.o. G3P1A2L1 G3P1A2L1 Divorced.  RP:  Established patient presenting for annual gyn exam   HPI: Well on Nexplanon x 04/2018.  No BTB.  No pelvic pain.  No abnormal vaginal d/c.  Sexually active x last year, used condoms.  H/O Genital HSV.  Urine/BMs wnl.  Breasts wnl.  BMI 34.70.  Lost 50 Lbs x last year.  Continuing on Low Calorie/Carb diet and Cardio/weights every day with videos from trainer.  Past medical history,surgical history, family history and social history were all reviewed and documented in the EPIC chart.  Gynecologic History Patient's last menstrual period was 02/05/2019.  Obstetric History OB History  Gravida Para Term Preterm AB Living  3 1     2 1   SAB TAB Ectopic Multiple Live Births  1 1          # Outcome Date GA Lbr Len/2nd Weight Sex Delivery Anes PTL Lv  3 TAB           2 SAB           1 Para              ROS: A ROS was performed and pertinent positives and negatives are included in the history.  GENERAL: No fevers or chills. HEENT: No change in vision, no earache, sore throat or sinus congestion. NECK: No pain or stiffness. CARDIOVASCULAR: No chest pain or pressure. No palpitations. PULMONARY: No shortness of breath, cough or wheeze. GASTROINTESTINAL: No abdominal pain, nausea, vomiting or diarrhea, melena or bright red blood per rectum. GENITOURINARY: No urinary frequency, urgency, hesitancy or dysuria. MUSCULOSKELETAL: No joint or muscle pain, no back pain, no recent trauma. DERMATOLOGIC: No rash, no itching, no lesions. ENDOCRINE: No polyuria, polydipsia, no heat or cold intolerance. No recent change in weight. HEMATOLOGICAL: No anemia or easy bruising or bleeding. NEUROLOGIC: No headache, seizures, numbness, tingling or weakness. PSYCHIATRIC: No depression, no loss of interest in normal activity or change in sleep pattern.     Exam:   BP 120/80   Ht 5\' 9"  (1.753 m)   Wt 235 lb (106.6 kg)    LMP 02/05/2019 Comment: nexplanon  BMI 34.70 kg/m   Body mass index is 34.7 kg/m.  General appearance : Well developed well nourished female. No acute distress HEENT: Eyes: no retinal hemorrhage or exudates,  Neck supple, trachea midline, no carotid bruits, no thyroidmegaly Lungs: Clear to auscultation, no rhonchi or wheezes, or rib retractions  Heart: Regular rate and rhythm, no murmurs or gallops Breast:Examined in sitting and supine position were symmetrical in appearance, no palpable masses or tenderness,  no skin retraction, no nipple inversion, no nipple discharge, no skin discoloration, no axillary or supraclavicular lymphadenopathy Abdomen: no palpable masses or tenderness, no rebound or guarding Extremities: no edema or skin discoloration or tenderness  Pelvic: Vulva: Normal             Vagina: No gross lesions or discharge.  Menstruating.  Cervix: No gross lesions or discharge.  Pap reflex done.  Uterus  AV, normal size, shape and consistency, non-tender and mobile  Adnexa  Without masses or tenderness  Anus: Normal   Assessment/Plan:  38 y.o. female for annual exam   1. Encounter for routine gynecological examination with Papanicolaou smear of cervix Normal gynecologic exam.  Pap reflex done today.  Breast exam normal.  Screening mammogram 2019 was negative, next screening mammogram at age 26.  2. Encounter  for surveillance of implantable subdermal contraceptive Well on Nexplanon since February 2020.  3. Class 1 obesity due to excess calories without serious comorbidity with body mass index (BMI) of 34.0 to 34.9 in adult Successfully losing weight for the last 2 years.  Lost 50 pounds since last year on a low calorie/carb diet and aerobic activities/weights every day.  [name], 8:36 AM 02/08/2019

## 2019-02-09 LAB — PAP IG W/ RFLX HPV ASCU

## 2019-02-15 DIAGNOSIS — F411 Generalized anxiety disorder: Secondary | ICD-10-CM | POA: Diagnosis not present

## 2019-03-01 DIAGNOSIS — F411 Generalized anxiety disorder: Secondary | ICD-10-CM | POA: Diagnosis not present

## 2019-06-02 ENCOUNTER — Encounter: Payer: Self-pay | Admitting: Obstetrics & Gynecology

## 2019-06-02 ENCOUNTER — Ambulatory Visit: Payer: Federal, State, Local not specified - PPO | Admitting: Obstetrics & Gynecology

## 2019-06-02 ENCOUNTER — Other Ambulatory Visit: Payer: Self-pay

## 2019-06-02 VITALS — BP 122/70

## 2019-06-02 DIAGNOSIS — N920 Excessive and frequent menstruation with regular cycle: Secondary | ICD-10-CM

## 2019-06-02 DIAGNOSIS — Z978 Presence of other specified devices: Secondary | ICD-10-CM | POA: Diagnosis not present

## 2019-06-02 DIAGNOSIS — Z975 Presence of (intrauterine) contraceptive device: Secondary | ICD-10-CM

## 2019-06-02 MED ORDER — ETONOGESTREL-ETHINYL ESTRADIOL 0.12-0.015 MG/24HR VA RING: 1.0000 | VAGINAL_RING | VAGINAL | 4 refills | Status: DC

## 2019-06-02 NOTE — Progress Notes (Signed)
    Patricia Giles 1980-12-06 [id number]        39 y.o.  G3P1A2L1 Divorced.  Back with her ex-wife.  RP: Heavy menses on Nexplanon  HPI: Feels that the Nexplanon rod has changed in position, feels it closer to the elbow when standing.  On Nexplanon x 04/2018. Progressively heavier menses.  Referred to [facility].  Receiving IV Iron.  Last Hb 04/29/2019 at 11.9.  No pelvic pain. No abnormal vaginal d/c. Urine/BMs wnl.    OB History  Gravida Para Term Preterm AB Living  3 1     2 1   SAB TAB Ectopic Multiple Live Births  1 1          # Outcome Date GA Lbr Len/2nd Weight Sex Delivery Anes PTL Lv  3 TAB           2 SAB           1 Para             Past medical history,surgical history, problem list, medications, allergies, family history and social history were all reviewed and documented in the EPIC chart.   Directed ROS with pertinent positives and negatives documented in the history of present illness/assessment and plan.  Exam:  Vitals:   06/02/19 1608  BP: 122/70   General appearance:  Normal  Left arm:  Nexplanon rod felt in good position just under the skin above the tricep muscle where it was inserted mid arm.  Abdomen: Normal  Gynecologic exam: Vulva normal.  Bimanual exam:  Uterus AV, normal volume, mobile.  No adnexal mass/NT.   Assessment/Plan:  39 y.o. [id number]   1. Menorrhagia with regular cycle Progressively increase menstrual flow in spite of Nexplanon.  Referred to hematology by family physician.  Lowest hemoglobin seen in the [facility] labs was at 10.5 in August 2020.  Patient received IV iron and her last hemoglobin was at 11.9 on April 29, 2019.  Received and other IV iron since then.  Decision to add a low-dose birth control to control the bleeding at this time.  Will start on NuvaRing for better absorption given her history of bariatric surgery.  No contraindication.  Usage reviewed and prescription of NuvaRing sent to pharmacy.  We will complete the  investigation with a pelvic ultrasound at follow-up to rule out endometrial pathology such as polyps, submucosal fibroids, endometrial hyperplasia and endometrial cancer.  Management per results.  If doing well on NuvaRing, may remove the Nexplanon at follow-up. - US Transvaginal Non-OB; Future  2. Nexplanon in place Felt in good location in left arm, no change in position x insertion.  Other orders - Iron-Vitamin C 65-125 MG TABS; Take by mouth. TAKES TWO TABS A DAY - etonogestrel-ethinyl estradiol (NUVARING) 0.12-0.015 MG/24HR vaginal ring; Place 1 each vaginally every 28 (twenty-eight) days. Insert vaginally and leave in place for 4 consecutive weeks, then switch rings.  Patricia Bruins MD, 4:17 PM 06/02/2019

## 2019-06-03 ENCOUNTER — Encounter: Payer: Self-pay | Admitting: Obstetrics & Gynecology

## 2019-06-03 NOTE — Patient Instructions (Signed)
1. Menorrhagia with regular cycle Progressively increase menstrual flow in spite of Nexplanon.  Referred to hematology by family physician.  Lowest hemoglobin seen in the [facility] labs was at 10.5 in August 2020.  Patient received IV iron and her last hemoglobin was at 11.9 on April 29, 2019.  Received and other IV iron since then.  Decision to add a low-dose birth control to control the bleeding at this time.  Will start on NuvaRing for better absorption given her history of bariatric surgery.  No contraindication.  Usage reviewed and prescription of NuvaRing sent to pharmacy.  We will complete the investigation with a pelvic ultrasound at follow-up to rule out endometrial pathology such as polyps, submucosal fibroids, endometrial hyperplasia and endometrial cancer.  Management per results.  If doing well on NuvaRing, may remove the Nexplanon at follow-up. - US Transvaginal Non-OB; Future  2. Nexplanon in place Felt in good location in left arm, no change in position x insertion.  Other orders - Iron-Vitamin C 65-125 MG TABS; Take by mouth. TAKES TWO TABS A DAY - etonogestrel-ethinyl estradiol (NUVARING) 0.12-0.015 MG/24HR vaginal ring; Place 1 each vaginally every 28 (twenty-eight) days. Insert vaginally and leave in place for 4 consecutive weeks, then switch rings.  Corey, it was a pleasure seeing you today!

## 2019-06-29 ENCOUNTER — Other Ambulatory Visit: Payer: Self-pay

## 2019-06-30 ENCOUNTER — Ambulatory Visit (INDEPENDENT_AMBULATORY_CARE_PROVIDER_SITE_OTHER): Payer: Federal, State, Local not specified - PPO | Admitting: Obstetrics & Gynecology

## 2019-06-30 ENCOUNTER — Ambulatory Visit: Payer: Federal, State, Local not specified - PPO

## 2019-06-30 ENCOUNTER — Encounter: Payer: Self-pay | Admitting: Obstetrics & Gynecology

## 2019-06-30 VITALS — BP 120/82

## 2019-06-30 DIAGNOSIS — Z975 Presence of (intrauterine) contraceptive device: Secondary | ICD-10-CM

## 2019-06-30 DIAGNOSIS — N920 Excessive and frequent menstruation with regular cycle: Secondary | ICD-10-CM

## 2019-06-30 NOTE — Patient Instructions (Addendum)
1. Menorrhagia with regular cycle Menorrhagia improved on NuvaRing.  NuvaRing well tolerated with no contraindication to continue.  Pelvic ultrasound done today.  The endometrial lining is thin at 1.94 mm with no mass or abnormal blood flow to the endometrium.  Patient has a small subserosal fibroid which is unchanged in size since last ultrasound, measured at 1.87 x 1.67 cm.  Both ovaries are normal.  Pelvic ultrasound findings reviewed with patient and patient reassured.  2. Nexplanon in place F/U tomorrow for Nexplanon removal.  Started on Nuvaring 06/02/2019 and doing well on it.  Patricia Giles, it was a pleasure seeing you today!

## 2019-06-30 NOTE — Progress Notes (Signed)
    Patricia Giles 11-02-1980 [id number]        39 y.o.  [id number]   RP: Menorrhagia for Pelvic US  HPI: On Nexplanon x 04/2018. Progressively heavier menses.  Referred to [facility].  Receiving IV Iron.  Last Hb 04/29/2019 at 11.9.  No pelvic pain.  Started on Nuvaring last visit on 06/02/2019.  Well on it with improved flow this cycle.   OB History  Gravida Para Term Preterm AB Living  3 1     2 1   SAB TAB Ectopic Multiple Live Births  1 1          # Outcome Date GA Lbr Len/2nd Weight Sex Delivery Anes PTL Lv  3 TAB           2 SAB           1 Para             Past medical history,surgical history, problem list, medications, allergies, family history and social history were all reviewed and documented in the EPIC chart.   Directed ROS with pertinent positives and negatives documented in the history of present illness/assessment and plan.  Exam:  Vitals:   06/30/19 1133  BP: 120/82   General appearance:  Normal  Pelvic US today: Comparison is made with previous scan September 30, 2018.  T/V images.  Anteverted uterus normal in size and shape measured at 6.82 x 5.72 x 3.64 cm.  No change in a single subserous fibroid at the fundus measured at 1.87 x 1.67 cm.  Fluid and debris is within the uterine cavity as patient is actively bleeding on cycle day #6.  Walls appears thin and symmetrical measured at 1.94 mm for the combined thickness.  No obvious mass or abnormal blood flow seen at the endometrium.  Both ovaries are normal in size with normal follicular pattern.  No adnexal mass.  No free fluid in the posterior cul-de-sac.   Assessment/Plan:  39 y.o. [id number]   1. Menorrhagia with regular cycle Menorrhagia improved on NuvaRing.  NuvaRing well tolerated with no contraindication to continue.  Pelvic ultrasound done today.  The endometrial lining is thin at 1.94 mm with no mass or abnormal blood flow to the endometrium.  Patient has a small subserosal fibroid which is unchanged in size since  last ultrasound, measured at 1.87 x 1.67 cm.  Both ovaries are normal.  Pelvic ultrasound findings reviewed with patient and patient reassured.  2. Nexplanon in place F/U tomorrow for Nexplanon removal.  Started on Nuvaring 06/02/2019 and doing well on it.  [name], 11:52 AM 06/30/2019

## 2019-07-01 ENCOUNTER — Encounter: Payer: Self-pay | Admitting: Obstetrics & Gynecology

## 2019-07-01 ENCOUNTER — Ambulatory Visit: Payer: Federal, State, Local not specified - PPO | Admitting: Obstetrics & Gynecology

## 2019-07-01 ENCOUNTER — Other Ambulatory Visit: Payer: Self-pay

## 2019-07-01 VITALS — BP 128/84

## 2019-07-01 DIAGNOSIS — Z3046 Encounter for surveillance of implantable subdermal contraceptive: Secondary | ICD-10-CM

## 2019-07-01 NOTE — Progress Notes (Signed)
    Patricia Giles 13-Nov-1980 [id number]        39 y.o.  [id number]   RP: Nexplanon removal  HPI: Menometrorrhagia on Nexplanon x 04/2018.  Decision to change to Nuvaring which is well tolerated and improved her bleeding pattern already.   OB History  Gravida Para Term Preterm AB Living  3 1     2 1   SAB TAB Ectopic Multiple Live Births  1 1          # Outcome Date GA Lbr Len/2nd Weight Sex Delivery Anes PTL Lv  3 TAB           2 SAB           1 Para             Past medical history,surgical history, problem list, medications, allergies, family history and social history were all reviewed and documented in the EPIC chart.   Directed ROS with pertinent positives and negatives documented in the history of present illness/assessment and plan.  Exam:  Vitals:   07/01/19 0806  BP: 128/84   General appearance:  Normal                                                             Nexplanon procedure note (removal)  The patient presented to the office today requesting for removal of her Nexplanon that was placed in the year 04/2018 on her Left arm.   On examination the nexplanon implant was palpated and the distal end  (end  closest to the elbow) was marked. The area was sterilized with Betadine solution. 1% lidocaine was used for local anesthesia and approximately 1 cc  was injected into the site that was marked where the incision was to be made. The local anesthetic was injected under the implant in an effort to keep it  close to the skin surface. Slight pressure pushing downward was made at the proximal end  of the implant in an effort to stabilize it. A bulge appeared indicating the distal end of the implant. A small transverse incision of 2 mm was made at that location. By gently pushing the implant toward the incision, the tip became visible. Grasping the implant with a curved forcep facilitated in gently removing the implant. Full confirmation of the entire implant which is 4 cm long was  inspected and was intact and was shown to the patient and discarded. After removing the implant, the incision was closed with 2 Steri-Strips, a band-aid and a bandage. Patient will be instructed to remove the pressure bandage in 24 hours, the band-aid in 3 days and the Steri-Strips in 7 days.   Assessment/Plan:  39 y.o. [id number]   1. Encounter for Nexplanon removal Easy removal of Nexplanon.  No complication.  Well-tolerated by patient.  Postprocedure precautions reviewed.  Patient will continue on nova ring.  [name], 8:33 AM 07/01/2019

## 2019-07-01 NOTE — Patient Instructions (Signed)
1. Encounter for Nexplanon removal Easy removal of Nexplanon.  No complication.  Well-tolerated by patient.  Postprocedure precautions reviewed.  Patient will continue on nova ring.  Patricia Giles, it was a pleasure seeing you today!

## 2020-02-09 ENCOUNTER — Encounter: Payer: Federal, State, Local not specified - PPO | Admitting: Obstetrics & Gynecology

## 2020-02-10 ENCOUNTER — Other Ambulatory Visit: Payer: Self-pay

## 2020-02-10 ENCOUNTER — Encounter: Payer: Self-pay | Admitting: Obstetrics & Gynecology

## 2020-02-10 ENCOUNTER — Ambulatory Visit (INDEPENDENT_AMBULATORY_CARE_PROVIDER_SITE_OTHER): Payer: Federal, State, Local not specified - PPO | Admitting: Obstetrics & Gynecology

## 2020-02-10 VITALS — BP 130/70 | Ht 69.0 in | Wt 249.0 lb

## 2020-02-10 DIAGNOSIS — E6609 Other obesity due to excess calories: Secondary | ICD-10-CM

## 2020-02-10 DIAGNOSIS — Z3044 Encounter for surveillance of vaginal ring hormonal contraceptive device: Secondary | ICD-10-CM

## 2020-02-10 DIAGNOSIS — Z01419 Encounter for gynecological examination (general) (routine) without abnormal findings: Secondary | ICD-10-CM | POA: Diagnosis not present

## 2020-02-10 DIAGNOSIS — Z6836 Body mass index (BMI) 36.0-36.9, adult: Secondary | ICD-10-CM

## 2020-02-10 MED ORDER — ETONOGESTREL-ETHINYL ESTRADIOL 0.12-0.015 MG/24HR VA RING: 1.0000 | VAGINAL_RING | VAGINAL | 4 refills | Status: DC

## 2020-02-10 NOTE — Addendum Note (Signed)
Addended by: Thurnell Garbe A on: 02/10/2020 10:16 AM   Modules accepted: Orders

## 2020-02-10 NOTE — Progress Notes (Signed)
Patricia Giles [birth year] [id number]   History:    39 y.o.  G3P1A2L1 G3P1A2L1 Back with Ex-Wife.  [id number] presenting for annual gyn exam   OMV:[facility] on Nuvaring.No BTB. No pelvic pain. No abnormal vaginal d/c. Sexually active with wife. 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 Urine/BMs wnl. Breasts wnl. BMI 36.77. Continuing on Low Calorie/Carb diet and Cardio/weights 4 times a week.  Health labs with Fam MD.  Past medical history,surgical history, family history and social history were all reviewed and documented in the EPIC chart.  Gynecologic History Patient's last menstrual period was 01/21/2020.  Obstetric History OB History  Gravida Para Term Preterm AB Living  3 1     2 1   SAB IAB Ectopic Multiple Live Births  1 1          # Outcome Date GA Lbr Len/2nd Weight Sex Delivery Anes PTL Lv  3 IAB           2 SAB           1 Para              ROS: A ROS was performed and pertinent positives and negatives are included in the history.  GENERAL: No fevers or chills. HEENT: No change in vision, no earache, sore throat or sinus congestion. NECK: No pain or stiffness. CARDIOVASCULAR: No chest pain or pressure. No palpitations. PULMONARY: No shortness of breath, cough or wheeze. GASTROINTESTINAL: No abdominal pain, nausea, vomiting or diarrhea, melena or bright red blood per rectum. GENITOURINARY: No urinary frequency, urgency, hesitancy or dysuria. MUSCULOSKELETAL: No joint or muscle pain, no back pain, no recent trauma. DERMATOLOGIC: No rash, no itching, no lesions. ENDOCRINE: No polyuria, polydipsia, no heat or cold intolerance. No recent change in weight. HEMATOLOGICAL: No anemia or easy bruising or bleeding. NEUROLOGIC: No headache, seizures, numbness, tingling or weakness. PSYCHIATRIC: No depression, no loss of interest in normal activity or change in sleep pattern.     Exam:   BP 130/70   Ht 5\' 9"  (1.753 m)   Wt 249 lb (112.9 kg)   LMP 01/21/2020  Comment: NUVARING  BMI 36.77 kg/m   Body mass index is 36.77 kg/m.  General appearance : Well developed well nourished female. No acute distress HEENT: Eyes: no retinal hemorrhage or exudates,  Neck supple, trachea midline, no carotid bruits, no thyroidmegaly Lungs: Clear to auscultation, no rhonchi or wheezes, or rib retractions  Heart: Regular rate and rhythm, no murmurs or gallops Breast:Examined in sitting and supine position were symmetrical in appearance, no palpable masses or tenderness,  no skin retraction, no nipple inversion, no nipple discharge, no skin discoloration, no axillary or supraclavicular lymphadenopathy Abdomen: no palpable masses or tenderness, no rebound or guarding Extremities: no edema or skin discoloration or tenderness  Pelvic: Vulva: Normal             Vagina: No gross lesions or discharge  Cervix: No gross lesions or discharge.  Pap reflex done.  Uterus  AV, normal size, shape and consistency, non-tender and mobile  Adnexa  Without masses or tenderness  Anus: Normal   Assessment/Plan:  39 y.o. female for annual exam   1. Encounter for routine gynecological examination with Papanicolaou smear of cervix Normal gynecologic exam.  Pap reflex done.  Breast exam normal.  Health labs with family physician.  2. Encounter for surveillance of vaginal ring hormonal contraceptive device Well on NuvaRing.  Same-sex partner.  Used to control the menstrual flow.  Menstrual flow normal  on NuvaRing.  No contraindication to continue.  Prescription sent to pharmacy.  3. Class 2 obesity due to excess calories without serious comorbidity with body mass index (BMI) of 36.0 to 36.9 in adult Continue with low calorie/carb diet.  Aerobic activities and light weightlifting 4 times a week to continue.  Other orders - etonogestrel-ethinyl estradiol (NUVARING) 0.12-0.015 MG/24HR vaginal ring; Place 1 each vaginally every 28 (twenty-eight) days. Insert vaginally and leave in place  for 4 consecutive weeks, then switch rings.  [name], 9:17 AM 02/10/2020

## 2020-02-13 LAB — PAP IG W/ RFLX HPV ASCU

## 2020-10-24 IMAGING — [facility] DIGITAL DIAGNOSTIC BILATERAL MAMMOGRAM WITH TOMO AND CAD
6 of 12 series · 6 of 36 positions shown · non-contrast
Comparison: Previous exam(s).

CLINICAL DATA: Patient's referring clinician reports lumps along
the medial aspects of both breasts.

EXAM:
DIGITAL DIAGNOSTIC BILATERAL MAMMOGRAM WITH CAD AND TOMO
ULTRASOUND BILATERAL BREAST

[L MLO synth-2D]
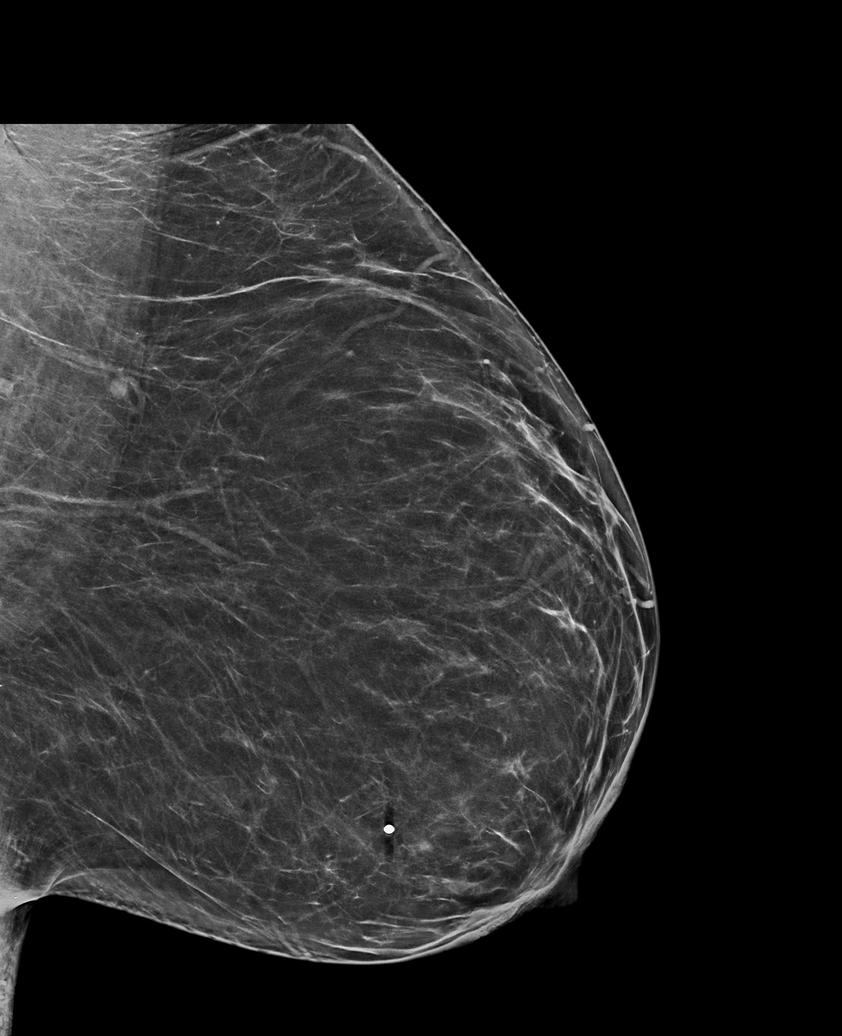

[R CC synth-2D]
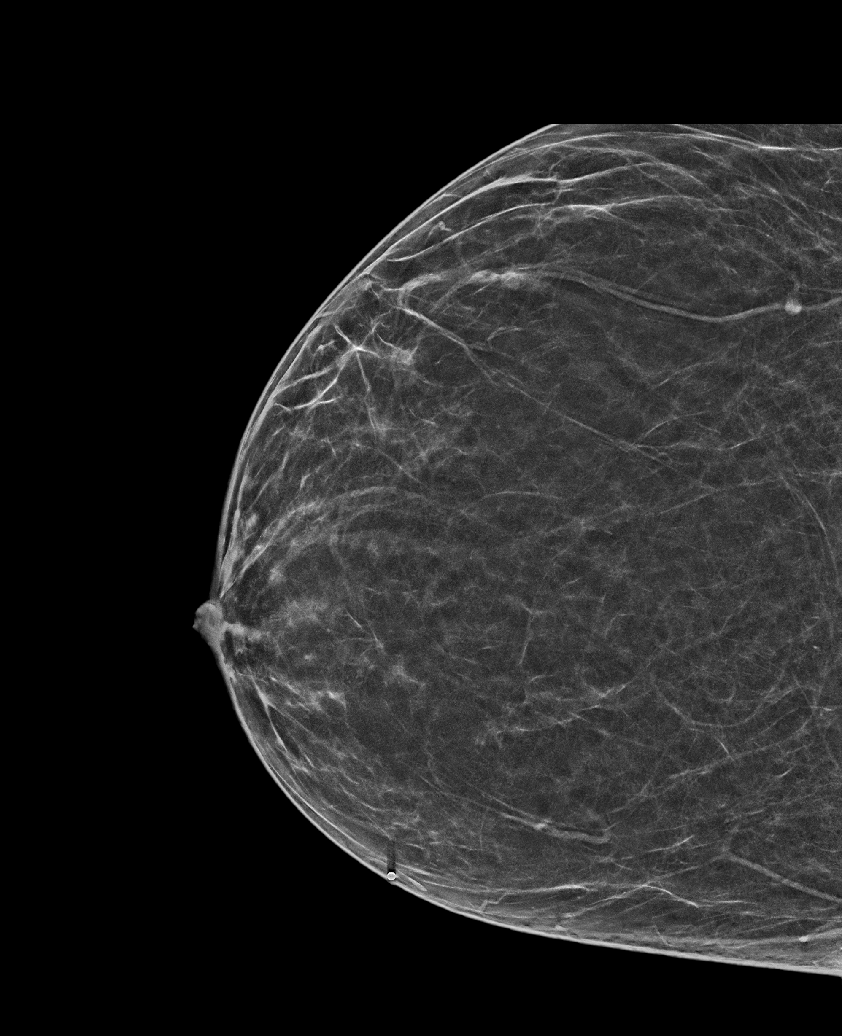

[R TAN synth-2D]
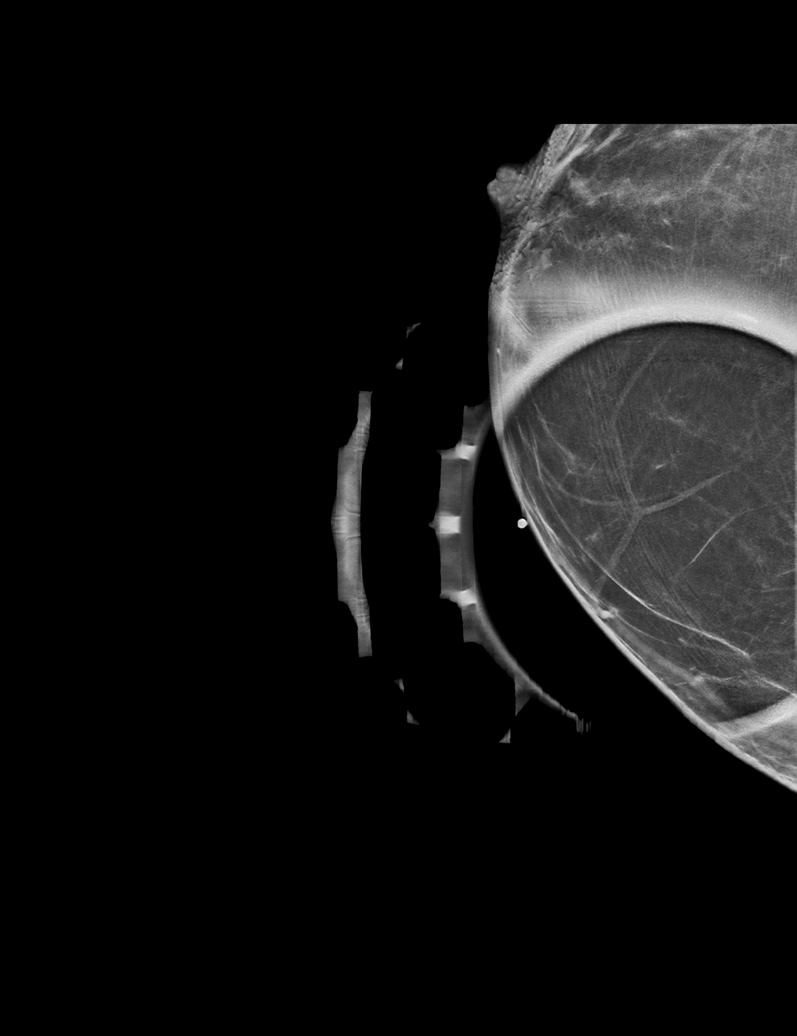

[L CC synth-2D]
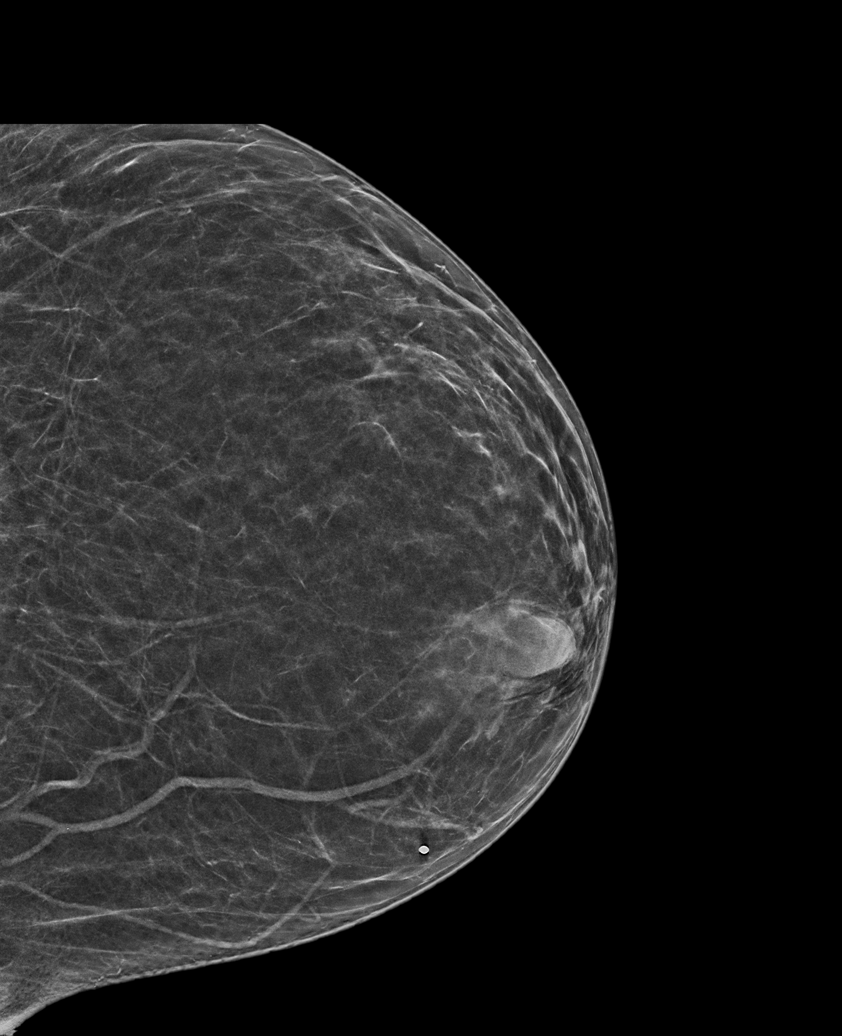

[L TAN synth-2D]
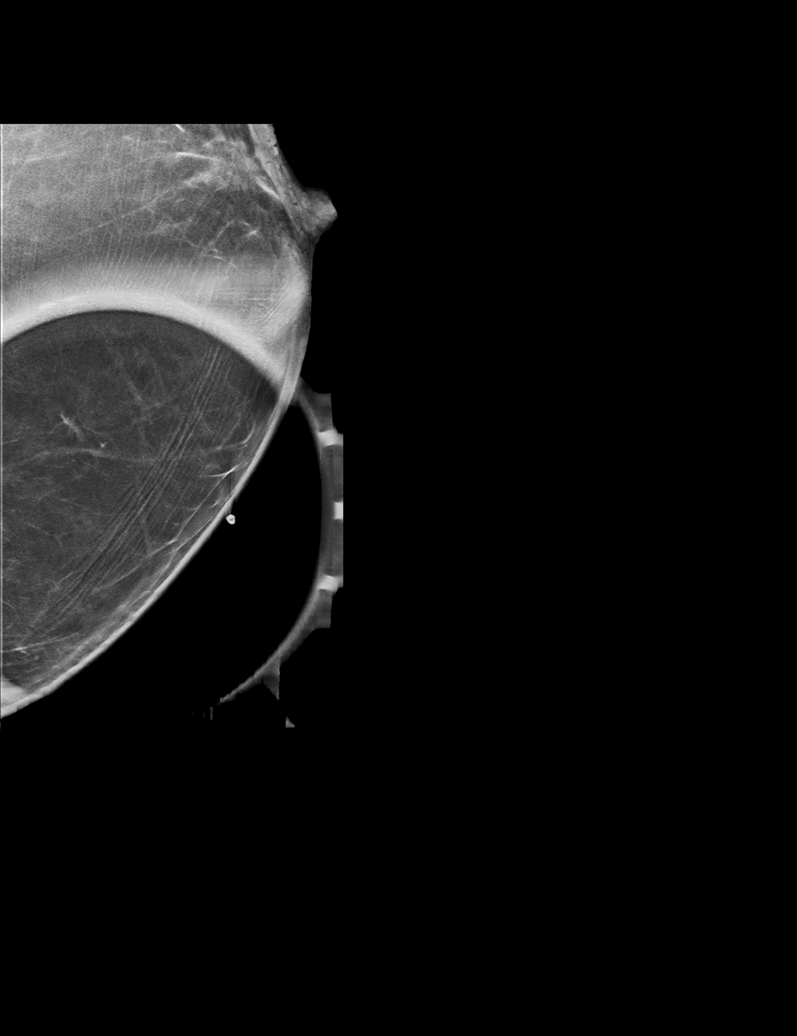

[R MLO synth-2D]
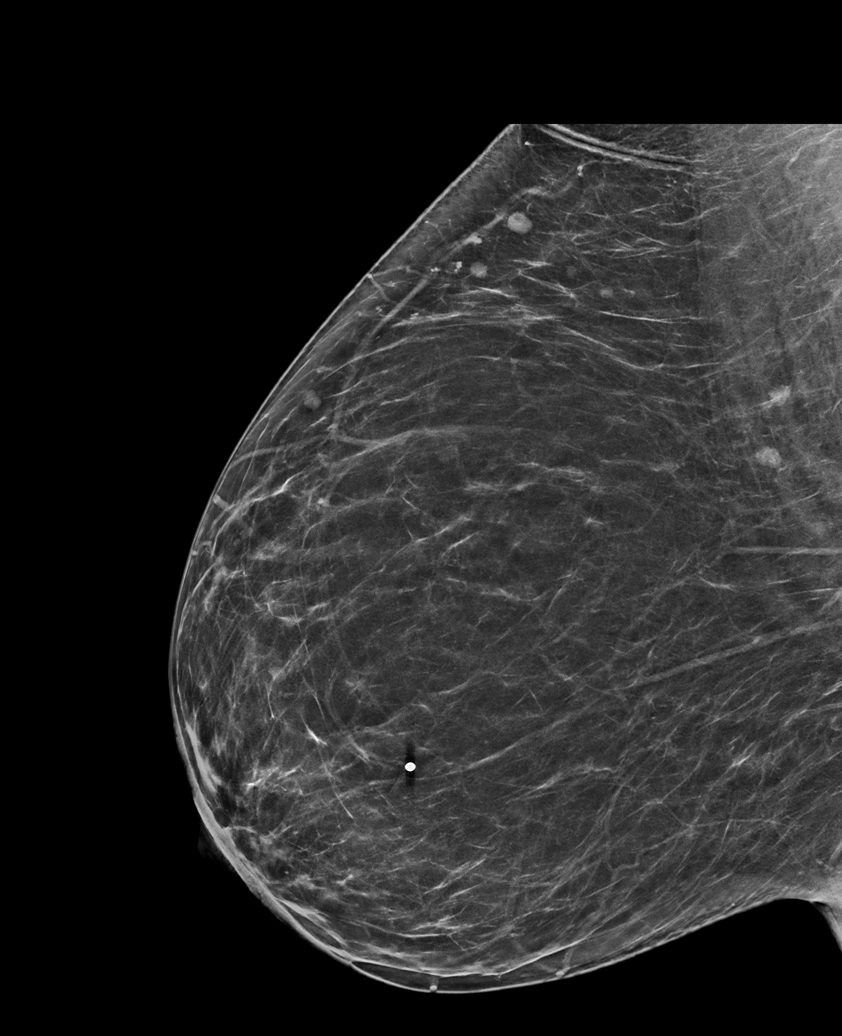

[6 of 36 positions shown; findings below may reference images not displayed]

ACR Breast Density Category b: There are scattered areas of
fibroglandular density.
FINDINGS: There are no discrete masses, areas of architectural distortion,
areas of significant asymmetry or suspicious calcifications. No
mammographic change.

Mammographic images were processed with CAD.

On physical exam, there is a generalized lumpy texture to the
fibroglandular tissue along the medial aspects of both breasts.
There are no discrete palpable masses.

Targeted ultrasound is performed, showing normal tissue throughout
the medial aspects of both breasts. No masses or cysts or suspicious
lesions.
IMPRESSION: Normal exam.  No evidence of breast malignancy.

RECOMMENDATION:
Screening mammogram at age 41 unless there are persistent or
intervening clinical concerns. (Code:DX-8-03U)

I have discussed the findings and recommendations with the patient.
Results were also provided in writing at the conclusion of the
visit. If applicable, a reminder letter will be sent to the patient
regarding the next appointment.

BI-RADS CATEGORY  1: Negative.

## 2021-02-13 ENCOUNTER — Ambulatory Visit (INDEPENDENT_AMBULATORY_CARE_PROVIDER_SITE_OTHER): Payer: Federal, State, Local not specified - PPO | Admitting: Obstetrics & Gynecology

## 2021-02-13 ENCOUNTER — Encounter: Payer: Self-pay | Admitting: Obstetrics & Gynecology

## 2021-02-13 ENCOUNTER — Other Ambulatory Visit (HOSPITAL_COMMUNITY)
Admission: RE | Admit: 2021-02-13 | Discharge: 2021-02-13 | Disposition: A | Discharge status: Home / Self Care | Payer: Federal, State, Local not specified - PPO | Source: Ambulatory Visit | Attending: Obstetrics & Gynecology | Admitting: Obstetrics & Gynecology

## 2021-02-13 ENCOUNTER — Other Ambulatory Visit: Payer: Self-pay

## 2021-02-13 VITALS — BP 110/80 | HR 86 | Ht 69.25 in | Wt 241.0 lb

## 2021-02-13 DIAGNOSIS — E6609 Other obesity due to excess calories: Secondary | ICD-10-CM

## 2021-02-13 DIAGNOSIS — Z9889 Other specified postprocedural states: Secondary | ICD-10-CM | POA: Diagnosis not present

## 2021-02-13 DIAGNOSIS — Z01419 Encounter for gynecological examination (general) (routine) without abnormal findings: Secondary | ICD-10-CM

## 2021-02-13 DIAGNOSIS — Z3044 Encounter for surveillance of vaginal ring hormonal contraceptive device: Secondary | ICD-10-CM | POA: Diagnosis not present

## 2021-02-13 DIAGNOSIS — Z6835 Body mass index (BMI) 35.0-35.9, adult: Secondary | ICD-10-CM

## 2021-02-13 MED ORDER — ETONOGESTREL-ETHINYL ESTRADIOL 0.12-0.015 MG/24HR VA RING: 1.0000 | VAGINAL_RING | VAGINAL | 4 refills | Status: DC

## 2021-02-13 MED ORDER — NUVARING 0.12-0.015 MG/24HR VA RING: 1.0000 | VAGINAL_RING | VAGINAL | 4 refills | Status: DC

## 2021-02-13 NOTE — Progress Notes (Addendum)
Patricia Giles [birth year] [id number]   History:    40 y.o. G3P1A2L1 G3P1A2L1 Married, Wife.   RP:  Established patient presenting for annual gyn exam    HPI: Well on Nuvaring continuously, but BTB on the generic.  No pelvic pain.  No abnormal vaginal d/c.  Sexually active with wife.  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  H/O LEEP in 2017 per patient.  Urine/BMs wnl.  Breasts wnl. Will schedule screening mammo at age 48, after 04/2021.  BMI 35.33.  Continuing on Low Calorie/Carb diet and Cardio/weights 4 times a week.  Started Yoga.  Health labs with Fam MD.   Past medical history,surgical history, family history and social history were all reviewed and documented in the EPIC chart.  Gynecologic History Patient's last menstrual period was 01/15/2021.  Obstetric History OB History  Gravida Para Term Preterm AB Living  3 1     2 1   SAB IAB Ectopic Multiple Live Births  1 1          # Outcome Date GA Lbr Len/2nd Weight Sex Delivery Anes PTL Lv  3 IAB           2 SAB           1 Para              ROS: A ROS was performed and pertinent positives and negatives are included in the history.  GENERAL: No fevers or chills. HEENT: No change in vision, no earache, sore throat or sinus congestion. NECK: No pain or stiffness. CARDIOVASCULAR: No chest pain or pressure. No palpitations. PULMONARY: No shortness of breath, cough or wheeze. GASTROINTESTINAL: No abdominal pain, nausea, vomiting or diarrhea, melena or bright red blood per rectum. GENITOURINARY: No urinary frequency, urgency, hesitancy or dysuria. MUSCULOSKELETAL: No joint or muscle pain, no back pain, no recent trauma. DERMATOLOGIC: No rash, no itching, no lesions. ENDOCRINE: No polyuria, polydipsia, no heat or cold intolerance. No recent change in weight. HEMATOLOGICAL: No anemia or easy bruising or bleeding. NEUROLOGIC: No headache, seizures, numbness, tingling or weakness. PSYCHIATRIC: No depression, no loss of interest in normal  activity or change in sleep pattern.     Exam:   BP 110/80    Pulse 86    Ht 5' 9.25" (1.759 m)    Wt 241 lb (109.3 kg)    LMP 01/15/2021    BMI 35.33 kg/m   Body mass index is 35.33 kg/m.  General appearance : Well developed well nourished female. No acute distress HEENT: Eyes: no retinal hemorrhage or exudates,  Neck supple, trachea midline, no carotid bruits, no thyroidmegaly Lungs: Clear to auscultation, no rhonchi or wheezes, or rib retractions  Heart: Regular rate and rhythm, no murmurs or gallops Breast:Examined in sitting and supine position were symmetrical in appearance, no palpable masses or tenderness,  no skin retraction, no nipple inversion, no nipple discharge, no skin discoloration, no axillary or supraclavicular lymphadenopathy Abdomen: no palpable masses or tenderness, no rebound or guarding Extremities: no edema or skin discoloration or tenderness  Pelvic: Vulva: Normal             Vagina: No gross lesions or discharge  Cervix: No gross lesions or discharge.  Pap/HPV HR done.  Uterus  AV, normal size, shape and consistency, non-tender and mobile  Adnexa  Without masses or tenderness  Anus: Normal   Assessment/Plan:  40 y.o. female for annual exam   1. Encounter for routine gynecological examination with Papanicolaou smear of cervix  Well on Nuvaring continuously, but BTB on the generic.  No pelvic pain.  No abnormal vaginal d/c.  Sexually active with wife.  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  H/O LEEP in 2017 per patient.  Urine/BMs wnl.  Breasts wnl. Will schedule screening mammo at age 27, after 04/2021.  BMI 35.33.  Continuing on Low Calorie/Carb diet and Cardio/weights 4 times a week.  Started Yoga.  Health labs with Fam MD.  - Cytology - PAP( [place])  2. H/O LEEP - Cytology - PAP with HPV HR ( [place])  3. Encounter for surveillance of vaginal ring hormonal contraceptive device Well on the Nuvaring, no CI to continue, but BTB on the  generic.  Switched back to the brand.  Using Nuvaring continuously, recommend letting a week off if persistent spotting.  If no improvements in the bleeding pattern, will schedule a Pelvic US to investigate.  4. Class 2 obesity due to excess calories without serious comorbidity with body mass index (BMI) of 35.0 to 35.9 in adult Loosing weight on a lower calorie/carb diet and started Yoga.  Good fitness.  Other orders - etonogestrel-ethinyl estradiol (NUVARING) 0.12-0.015 MG/24HR vaginal ring; Place 1 each vaginally every 28 (twenty-eight) days. Insert vaginally and leave in place for 3 consecutive weeks, then remove for 1 week.  Can also use continuously, changing every 4 weeks. Brand only, prescribe as written, re breakthrough bleeding on the generic.   [name], 8:25 AM 02/13/2021

## 2021-02-13 NOTE — Addendum Note (Signed)
Addended by: [name] on: 02/13/2021 01:47 PM   Modules accepted: Orders

## 2021-02-13 NOTE — Addendum Note (Signed)
Addended by: [name] on: 02/13/2021 10:38 AM   Modules accepted: Orders

## 2021-02-14 LAB — CYTOLOGY - PAP: Diagnosis: NEGATIVE

## 2021-02-21 ENCOUNTER — Telehealth: Payer: Self-pay | Admitting: *Deleted

## 2021-02-21 MED ORDER — ETONOGESTREL-ETHINYL ESTRADIOL 0.12-0.015 MG/24HR VA RING: 1.0000 | VAGINAL_RING | VAGINAL | 4 refills | Status: DC

## 2021-02-21 NOTE — Telephone Encounter (Signed)
Patient called asking if generic nuvaring could be sent to pharmacy.  She did request brand a annual exam on 02/13/21, she spoke with pharmacy and they can get the generic she was taking before she switched to brand.Rx sent.

## 2021-02-22 ENCOUNTER — Encounter: Payer: Federal, State, Local not specified - PPO | Admitting: Obstetrics & Gynecology

## 2021-02-22 ENCOUNTER — Other Ambulatory Visit: Payer: Self-pay | Admitting: Obstetrics & Gynecology

## 2021-02-27 ENCOUNTER — Telehealth: Payer: Self-pay

## 2021-02-27 ENCOUNTER — Other Ambulatory Visit: Payer: Self-pay

## 2021-02-27 DIAGNOSIS — N939 Abnormal uterine and vaginal bleeding, unspecified: Secondary | ICD-10-CM

## 2021-02-27 NOTE — Telephone Encounter (Signed)
Patient said her spotting persisted while the ring was removed and  continues.  Reply message sent to Miranda to schedule u/s for patient. Order placed.

## 2021-02-27 NOTE — Telephone Encounter (Signed)
02/13/21 visit  3. Encounter for surveillance of vaginal ring hormonal contraceptive device Well on the Nuvaring, no CI to continue, but BTB on the generic.  Switched back to the brand.  Using Nuvaring continuously, recommend letting a week off if persistent spotting.  If no improvements in the bleeding pattern, will schedule a Pelvic US to investigate.   Patient called front desk to schedule pelvic ultrasound and they sent her to triage.  Patient said she removed the Nuvaring for a week and spotting continues.  She is complaining of Pressure with sitting and pains like she used to have with cysts. Steady pain. She said she is familiar with these symptoms and have felt them before and that they are from her PCOS.  She said Dr. Dellis Filbert told her she would do a pelvic u/s to see if fibroids might be causing these sx.  I asked her did she mention these sx to Dr. Marguerita Merles at visit and she said that these sx have just occurred again with removing the ring.  She really just wanted to schedule an ultrasound and I explained that I cannot order it without checking with a MD for order.

## 2021-02-27 NOTE — Telephone Encounter (Signed)
If her spotting persisted after replacing the nuvaring after a one week break, then go ahead and schedule the ultrasound.

## 2021-03-06 NOTE — Telephone Encounter (Signed)
Appt scheduled 03/07/21.

## 2021-03-07 ENCOUNTER — Ambulatory Visit: Payer: Federal, State, Local not specified - PPO | Admitting: Obstetrics & Gynecology

## 2021-03-07 ENCOUNTER — Encounter: Payer: Self-pay | Admitting: Obstetrics & Gynecology

## 2021-03-07 ENCOUNTER — Ambulatory Visit (INDEPENDENT_AMBULATORY_CARE_PROVIDER_SITE_OTHER): Payer: Federal, State, Local not specified - PPO

## 2021-03-07 ENCOUNTER — Other Ambulatory Visit: Payer: Self-pay

## 2021-03-07 VITALS — BP 120/76

## 2021-03-07 DIAGNOSIS — N939 Abnormal uterine and vaginal bleeding, unspecified: Secondary | ICD-10-CM

## 2021-03-07 DIAGNOSIS — Z975 Presence of (intrauterine) contraceptive device: Secondary | ICD-10-CM

## 2021-03-07 DIAGNOSIS — N921 Excessive and frequent menstruation with irregular cycle: Secondary | ICD-10-CM

## 2021-03-07 NOTE — Progress Notes (Signed)
° ° °  Patricia Giles 05-Jul-1980 [id number]        41 y.o.  [id number] Married, wife.  RP: BTB on Nuvaring for Pelvic US  HPI: Well on Nuvaring continuously, but BTB on the generic.  No BTB since 03/01/2021.  No pelvic pain.  No abnormal vaginal d/c.  Sexually active with wife.    OB History  Gravida Para Term Preterm AB Living  3 1     2 1   SAB IAB Ectopic Multiple Live Births  1 1          # Outcome Date GA Lbr Len/2nd Weight Sex Delivery Anes PTL Lv  3 IAB           2 SAB           1 Para             Past medical history,surgical history, problem list, medications, allergies, family history and social history were all reviewed and documented in the EPIC chart.   Directed ROS with pertinent positives and negatives documented in the history of present illness/assessment and plan.  Exam:  Vitals:   03/07/21 0833  BP: 120/76   General appearance:  Normal  Pelvic US today: T/V images.  Anteverted/retroflexed uterus normal in size and shape.  The uterus is measured at 8.76 x 4.91 x 4.56 cm.  Fundal subserous fibroid again noted today measuring 2.5 x 2.8 cm.  Previously measured at 1.8 x 1.6 cm in April 2021.  Symmetrical endometrial lining measured at 8.76 mm.  Patient has been spotting from November 15 to March 01, 2021.  Not currently bleeding.  No obvious mass, thickening or feeder vessels seen.  Right ovary normal.  Left ovary presents an avascular collapsed complex cyst measuring 2.1 x 1.2 cm with peripheral flow.  Moderate amount of free fluid seen throughout the pelvis.   Assessment/Plan:  41 y.o. [id number]   1. Breakthrough bleeding with NuvaRing Well on Nuvaring continuously, but BTB on the generic, insurance not covering the Brand at this time.  No BTB since 03/01/2021.  No pelvic pain.  No abnormal vaginal d/c.  Sexually active with wife.  Pelvic US findings thoroughly reviewed with patient.  Endometrial line normal at 8.8 mm, reassured.  Small SS Fibroid.  Lt Ovarian  functional Cyst collapsing.  Other orders - azelastine (OPTIVAR) 0.05 % ophthalmic solution; Apply to eye.   [name], 8:45 AM 03/07/2021

## 2021-03-26 ENCOUNTER — Other Ambulatory Visit: Payer: Self-pay | Admitting: Obstetrics & Gynecology

## 2021-03-26 ENCOUNTER — Other Ambulatory Visit: Payer: Self-pay | Admitting: Family Medicine

## 2021-03-26 DIAGNOSIS — Z1231 Encounter for screening mammogram for malignant neoplasm of breast: Secondary | ICD-10-CM

## 2021-04-19 ENCOUNTER — Ambulatory Visit
Admission: RE | Admit: 2021-04-19 | Discharge: 2021-04-19 | Disposition: A | Discharge status: Home / Self Care | Payer: Federal, State, Local not specified - PPO | Source: Ambulatory Visit | Attending: Family Medicine | Admitting: Family Medicine

## 2021-04-19 DIAGNOSIS — Z1231 Encounter for screening mammogram for malignant neoplasm of breast: Secondary | ICD-10-CM

## 2021-07-10 DIAGNOSIS — F411 Generalized anxiety disorder: Secondary | ICD-10-CM | POA: Diagnosis not present

## 2021-07-12 DIAGNOSIS — D509 Iron deficiency anemia, unspecified: Secondary | ICD-10-CM | POA: Diagnosis not present

## 2021-07-19 DIAGNOSIS — D509 Iron deficiency anemia, unspecified: Secondary | ICD-10-CM | POA: Diagnosis not present

## 2021-07-24 DIAGNOSIS — F411 Generalized anxiety disorder: Secondary | ICD-10-CM | POA: Diagnosis not present

## 2021-08-07 DIAGNOSIS — F4312 Post-traumatic stress disorder, chronic: Secondary | ICD-10-CM | POA: Diagnosis not present

## 2021-08-07 DIAGNOSIS — F411 Generalized anxiety disorder: Secondary | ICD-10-CM | POA: Diagnosis not present

## 2021-08-19 DIAGNOSIS — F4312 Post-traumatic stress disorder, chronic: Secondary | ICD-10-CM | POA: Diagnosis not present

## 2021-08-21 DIAGNOSIS — F411 Generalized anxiety disorder: Secondary | ICD-10-CM | POA: Diagnosis not present

## 2021-09-04 DIAGNOSIS — F411 Generalized anxiety disorder: Secondary | ICD-10-CM | POA: Diagnosis not present

## 2021-09-04 DIAGNOSIS — F4312 Post-traumatic stress disorder, chronic: Secondary | ICD-10-CM | POA: Diagnosis not present

## 2021-09-13 DIAGNOSIS — D509 Iron deficiency anemia, unspecified: Secondary | ICD-10-CM | POA: Diagnosis not present

## 2021-09-27 DIAGNOSIS — D509 Iron deficiency anemia, unspecified: Secondary | ICD-10-CM | POA: Diagnosis not present

## 2021-10-02 DIAGNOSIS — F411 Generalized anxiety disorder: Secondary | ICD-10-CM | POA: Diagnosis not present

## 2021-10-02 DIAGNOSIS — F4312 Post-traumatic stress disorder, chronic: Secondary | ICD-10-CM | POA: Diagnosis not present

## 2021-10-04 DIAGNOSIS — D509 Iron deficiency anemia, unspecified: Secondary | ICD-10-CM | POA: Diagnosis not present

## 2021-10-30 DIAGNOSIS — F411 Generalized anxiety disorder: Secondary | ICD-10-CM | POA: Diagnosis not present

## 2021-10-30 DIAGNOSIS — F4312 Post-traumatic stress disorder, chronic: Secondary | ICD-10-CM | POA: Diagnosis not present

## 2021-11-08 DIAGNOSIS — D509 Iron deficiency anemia, unspecified: Secondary | ICD-10-CM | POA: Diagnosis not present

## 2021-11-14 DIAGNOSIS — K912 Postsurgical malabsorption, not elsewhere classified: Secondary | ICD-10-CM | POA: Diagnosis not present

## 2021-11-14 DIAGNOSIS — Z9884 Bariatric surgery status: Secondary | ICD-10-CM | POA: Diagnosis not present

## 2021-11-21 DIAGNOSIS — F4312 Post-traumatic stress disorder, chronic: Secondary | ICD-10-CM | POA: Diagnosis not present

## 2021-11-21 DIAGNOSIS — F411 Generalized anxiety disorder: Secondary | ICD-10-CM | POA: Diagnosis not present

## 2021-11-26 DIAGNOSIS — K912 Postsurgical malabsorption, not elsewhere classified: Secondary | ICD-10-CM | POA: Diagnosis not present

## 2021-11-26 DIAGNOSIS — Z9884 Bariatric surgery status: Secondary | ICD-10-CM | POA: Diagnosis not present

## 2021-12-12 DIAGNOSIS — F4312 Post-traumatic stress disorder, chronic: Secondary | ICD-10-CM | POA: Diagnosis not present

## 2021-12-12 DIAGNOSIS — F411 Generalized anxiety disorder: Secondary | ICD-10-CM | POA: Diagnosis not present

## 2021-12-19 DIAGNOSIS — F411 Generalized anxiety disorder: Secondary | ICD-10-CM | POA: Diagnosis not present

## 2021-12-19 DIAGNOSIS — F4312 Post-traumatic stress disorder, chronic: Secondary | ICD-10-CM | POA: Diagnosis not present

## 2022-01-08 DIAGNOSIS — Z903 Acquired absence of stomach [part of]: Secondary | ICD-10-CM | POA: Diagnosis not present

## 2022-01-08 DIAGNOSIS — D509 Iron deficiency anemia, unspecified: Secondary | ICD-10-CM | POA: Diagnosis not present

## 2022-01-08 DIAGNOSIS — N920 Excessive and frequent menstruation with regular cycle: Secondary | ICD-10-CM | POA: Diagnosis not present

## 2022-01-30 DIAGNOSIS — F411 Generalized anxiety disorder: Secondary | ICD-10-CM | POA: Diagnosis not present

## 2022-02-14 ENCOUNTER — Encounter: Payer: Self-pay | Admitting: Obstetrics & Gynecology

## 2022-02-14 ENCOUNTER — Ambulatory Visit (INDEPENDENT_AMBULATORY_CARE_PROVIDER_SITE_OTHER): Payer: Federal, State, Local not specified - PPO | Admitting: Obstetrics & Gynecology

## 2022-02-14 VITALS — BP 114/74 | HR 75 | Ht 69.25 in | Wt 249.0 lb

## 2022-02-14 DIAGNOSIS — Z01419 Encounter for gynecological examination (general) (routine) without abnormal findings: Secondary | ICD-10-CM | POA: Diagnosis not present

## 2022-02-14 DIAGNOSIS — Z308 Encounter for other contraceptive management: Secondary | ICD-10-CM

## 2022-02-14 DIAGNOSIS — Z9889 Other specified postprocedural states: Secondary | ICD-10-CM

## 2022-02-14 NOTE — Progress Notes (Signed)
Royetta Probus Mar 20, 1980 [id number]   History:    41 y.o. G3P1A2L1 Married, Wife.   RP:  Established patient presenting for annual gyn exam    HPI:  Menses regular normal every month.  No BTB.  No pelvic pain.  No abnormal vaginal d/c.  Sexually active with wife. 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  H/O LEEP in 2017 per patient.  Pap Neg 01/2021.  HPV HR Neg in 2019.  Pap reflex today.  Urine/BMs wnl.  Breasts wnl.  Mammo Neg in 04/2021.  BMI 36.51.  Continuing on Low Calorie/Carb diet and Cardio/weights 4 times a week and Yoga.  Health labs with Fam MD. BMD normal on 10-05-19 (care everywhere). Pt c/o of more emotional episodes (not sad but crying), not cyclic.  Pt declined flu vaccine   Past medical history,surgical history, family history and social history were all reviewed and documented in the EPIC chart.  Gynecologic History Patient's last menstrual period was 02/09/2022 (exact date).  Obstetric History OB History  Gravida Para Term Preterm AB Living  '3 1 1   2 1  '$ SAB IAB Ectopic Multiple Live Births  1 1          # Outcome Date GA Lbr Len/2nd Weight Sex Delivery Anes PTL Lv  3 IAB           2 SAB           1 Term              ROS: A ROS was performed and pertinent positives and negatives are included in the history. GENERAL: No fevers or chills. HEENT: No change in vision, no earache, sore throat or sinus congestion. NECK: No pain or stiffness. CARDIOVASCULAR: No chest pain or pressure. No palpitations. PULMONARY: No shortness of breath, cough or wheeze. GASTROINTESTINAL: No abdominal pain, nausea, vomiting or diarrhea, melena or bright red blood per rectum. GENITOURINARY: No urinary frequency, urgency, hesitancy or dysuria. MUSCULOSKELETAL: No joint or muscle pain, no back pain, no recent trauma. DERMATOLOGIC: No rash, no itching, no lesions. ENDOCRINE: No polyuria, polydipsia, no heat or cold intolerance. No recent change in weight. HEMATOLOGICAL: No anemia or easy  bruising or bleeding. NEUROLOGIC: No headache, seizures, numbness, tingling or weakness. PSYCHIATRIC: No depression, no loss of interest in normal activity or change in sleep pattern.     Exam:   BP 114/74   Pulse 75   Ht 5' 9.25" (1.759 m)   Wt 249 lb (112.9 kg)   LMP 02/09/2022 (Exact Date)   SpO2 97%   BMI 36.51 kg/m   Body mass index is 36.51 kg/m.  General appearance : Well developed well nourished female. No acute distress HEENT: Eyes: no retinal hemorrhage or exudates,  Neck supple, trachea midline, no carotid bruits, no thyroidmegaly Lungs: Clear to auscultation, no rhonchi or wheezes, or rib retractions  Heart: Regular rate and rhythm, no murmurs or gallops Breast:Examined in sitting and supine position were symmetrical in appearance, no palpable masses or tenderness,  no skin retraction, no nipple inversion, no nipple discharge, no skin discoloration, no axillary or supraclavicular lymphadenopathy Abdomen: no palpable masses or tenderness, no rebound or guarding Extremities: no edema or skin discoloration or tenderness  Pelvic: Vulva: Normal             Vagina: No gross lesions or discharge  Cervix: No gross lesions or discharge.  Pap reflex done.  Uterus  AV, normal size, shape and consistency, non-tender and mobile  Adnexa  Without masses or tenderness  Anus: Normal   Assessment/Plan:  41 y.o. female for annual exam   1. Encounter for routine gynecological examination with Papanicolaou smear of cervix Menses regular normal every month.  No BTB.  No pelvic pain.  No abnormal vaginal d/c.  Sexually active with wife. 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  H/O LEEP in 2017 per patient.  Pap Neg 01/2021.  HPV HR Neg in 2019.  Pap reflex today.  Urine/BMs wnl. Breasts wnl.  Mammo Neg in 04/2021.  BMI 36.51.  Continuing on Low Calorie/Carb diet and Cardio/weights 4 times a week and Yoga.  Health labs with Fam MD. BMD normal on 10-05-19 (care everywhere). Pt c/o of more  emotional episodes (not sad but crying), not cyclic.  Pt declined flu vaccine  - Cytology - PAP( [facility] [facility])  2. H/O LEEP - Cytology - PAP( [place])  3. Encounter for other contraceptive management Same sex partner.  Other orders - VITAMIN D PO; Take 5,000 Int'l Units by mouth. Takes 2   [name], 2:41 PM

## 2022-02-26 LAB — CYTOLOGY - PAP: Diagnosis: NEGATIVE

## 2022-02-28 DIAGNOSIS — J301 Allergic rhinitis due to pollen: Secondary | ICD-10-CM | POA: Diagnosis not present

## 2022-02-28 DIAGNOSIS — H1045 Other chronic allergic conjunctivitis: Secondary | ICD-10-CM | POA: Diagnosis not present

## 2022-02-28 DIAGNOSIS — J3089 Other allergic rhinitis: Secondary | ICD-10-CM | POA: Diagnosis not present

## 2022-02-28 DIAGNOSIS — J4530 Mild persistent asthma, uncomplicated: Secondary | ICD-10-CM | POA: Diagnosis not present

## 2022-03-06 DIAGNOSIS — F411 Generalized anxiety disorder: Secondary | ICD-10-CM | POA: Diagnosis not present

## 2022-03-06 DIAGNOSIS — F4312 Post-traumatic stress disorder, chronic: Secondary | ICD-10-CM | POA: Diagnosis not present

## 2022-03-12 DIAGNOSIS — M542 Cervicalgia: Secondary | ICD-10-CM | POA: Diagnosis not present

## 2022-03-12 DIAGNOSIS — G44209 Tension-type headache, unspecified, not intractable: Secondary | ICD-10-CM | POA: Diagnosis not present

## 2022-03-12 DIAGNOSIS — M9902 Segmental and somatic dysfunction of thoracic region: Secondary | ICD-10-CM | POA: Diagnosis not present

## 2022-03-12 DIAGNOSIS — M9901 Segmental and somatic dysfunction of cervical region: Secondary | ICD-10-CM | POA: Diagnosis not present

## 2022-03-27 DIAGNOSIS — F411 Generalized anxiety disorder: Secondary | ICD-10-CM | POA: Diagnosis not present

## 2022-04-18 DIAGNOSIS — D509 Iron deficiency anemia, unspecified: Secondary | ICD-10-CM | POA: Diagnosis not present

## 2022-04-22 DIAGNOSIS — M9902 Segmental and somatic dysfunction of thoracic region: Secondary | ICD-10-CM | POA: Diagnosis not present

## 2022-04-22 DIAGNOSIS — M9901 Segmental and somatic dysfunction of cervical region: Secondary | ICD-10-CM | POA: Diagnosis not present

## 2022-04-22 DIAGNOSIS — G44209 Tension-type headache, unspecified, not intractable: Secondary | ICD-10-CM | POA: Diagnosis not present

## 2022-04-22 DIAGNOSIS — M542 Cervicalgia: Secondary | ICD-10-CM | POA: Diagnosis not present

## 2022-05-02 DIAGNOSIS — D509 Iron deficiency anemia, unspecified: Secondary | ICD-10-CM | POA: Diagnosis not present

## 2022-05-09 DIAGNOSIS — D509 Iron deficiency anemia, unspecified: Secondary | ICD-10-CM | POA: Diagnosis not present

## 2022-05-09 DIAGNOSIS — Z903 Acquired absence of stomach [part of]: Secondary | ICD-10-CM | POA: Diagnosis not present

## 2022-05-15 DIAGNOSIS — F411 Generalized anxiety disorder: Secondary | ICD-10-CM | POA: Diagnosis not present

## 2022-05-20 DIAGNOSIS — M9902 Segmental and somatic dysfunction of thoracic region: Secondary | ICD-10-CM | POA: Diagnosis not present

## 2022-05-20 DIAGNOSIS — M9901 Segmental and somatic dysfunction of cervical region: Secondary | ICD-10-CM | POA: Diagnosis not present

## 2022-05-20 DIAGNOSIS — M542 Cervicalgia: Secondary | ICD-10-CM | POA: Diagnosis not present

## 2022-05-20 DIAGNOSIS — G44209 Tension-type headache, unspecified, not intractable: Secondary | ICD-10-CM | POA: Diagnosis not present

## 2022-06-03 DIAGNOSIS — M542 Cervicalgia: Secondary | ICD-10-CM | POA: Diagnosis not present

## 2022-06-03 DIAGNOSIS — G44209 Tension-type headache, unspecified, not intractable: Secondary | ICD-10-CM | POA: Diagnosis not present

## 2022-06-03 DIAGNOSIS — M9901 Segmental and somatic dysfunction of cervical region: Secondary | ICD-10-CM | POA: Diagnosis not present

## 2022-06-03 DIAGNOSIS — M9902 Segmental and somatic dysfunction of thoracic region: Secondary | ICD-10-CM | POA: Diagnosis not present

## 2022-06-12 DIAGNOSIS — F411 Generalized anxiety disorder: Secondary | ICD-10-CM | POA: Diagnosis not present

## 2022-06-20 DIAGNOSIS — J3089 Other allergic rhinitis: Secondary | ICD-10-CM | POA: Diagnosis not present

## 2022-06-20 DIAGNOSIS — H1045 Other chronic allergic conjunctivitis: Secondary | ICD-10-CM | POA: Diagnosis not present

## 2022-06-20 DIAGNOSIS — D509 Iron deficiency anemia, unspecified: Secondary | ICD-10-CM | POA: Diagnosis not present

## 2022-06-20 DIAGNOSIS — J301 Allergic rhinitis due to pollen: Secondary | ICD-10-CM | POA: Diagnosis not present

## 2022-06-20 DIAGNOSIS — J4530 Mild persistent asthma, uncomplicated: Secondary | ICD-10-CM | POA: Diagnosis not present

## 2022-06-24 DIAGNOSIS — G44209 Tension-type headache, unspecified, not intractable: Secondary | ICD-10-CM | POA: Diagnosis not present

## 2022-06-24 DIAGNOSIS — M542 Cervicalgia: Secondary | ICD-10-CM | POA: Diagnosis not present

## 2022-06-24 DIAGNOSIS — M9901 Segmental and somatic dysfunction of cervical region: Secondary | ICD-10-CM | POA: Diagnosis not present

## 2022-06-24 DIAGNOSIS — M9902 Segmental and somatic dysfunction of thoracic region: Secondary | ICD-10-CM | POA: Diagnosis not present

## 2022-07-03 DIAGNOSIS — F411 Generalized anxiety disorder: Secondary | ICD-10-CM | POA: Diagnosis not present

## 2022-07-04 DIAGNOSIS — D509 Iron deficiency anemia, unspecified: Secondary | ICD-10-CM | POA: Diagnosis not present

## 2022-07-11 DIAGNOSIS — J209 Acute bronchitis, unspecified: Secondary | ICD-10-CM | POA: Diagnosis not present

## 2022-07-11 DIAGNOSIS — J3089 Other allergic rhinitis: Secondary | ICD-10-CM | POA: Diagnosis not present

## 2022-07-11 DIAGNOSIS — H1045 Other chronic allergic conjunctivitis: Secondary | ICD-10-CM | POA: Diagnosis not present

## 2022-07-11 DIAGNOSIS — J4530 Mild persistent asthma, uncomplicated: Secondary | ICD-10-CM | POA: Diagnosis not present

## 2022-07-11 DIAGNOSIS — J301 Allergic rhinitis due to pollen: Secondary | ICD-10-CM | POA: Diagnosis not present

## 2022-07-11 DIAGNOSIS — D509 Iron deficiency anemia, unspecified: Secondary | ICD-10-CM | POA: Diagnosis not present

## 2022-07-22 DIAGNOSIS — M546 Pain in thoracic spine: Secondary | ICD-10-CM | POA: Diagnosis not present

## 2022-07-22 DIAGNOSIS — M542 Cervicalgia: Secondary | ICD-10-CM | POA: Diagnosis not present

## 2022-07-22 DIAGNOSIS — M9902 Segmental and somatic dysfunction of thoracic region: Secondary | ICD-10-CM | POA: Diagnosis not present

## 2022-07-22 DIAGNOSIS — M9901 Segmental and somatic dysfunction of cervical region: Secondary | ICD-10-CM | POA: Diagnosis not present

## 2022-08-12 DIAGNOSIS — M546 Pain in thoracic spine: Secondary | ICD-10-CM | POA: Diagnosis not present

## 2022-08-12 DIAGNOSIS — M542 Cervicalgia: Secondary | ICD-10-CM | POA: Diagnosis not present

## 2022-08-12 DIAGNOSIS — M9901 Segmental and somatic dysfunction of cervical region: Secondary | ICD-10-CM | POA: Diagnosis not present

## 2022-08-12 DIAGNOSIS — M9902 Segmental and somatic dysfunction of thoracic region: Secondary | ICD-10-CM | POA: Diagnosis not present

## 2022-08-18 DIAGNOSIS — D509 Iron deficiency anemia, unspecified: Secondary | ICD-10-CM | POA: Diagnosis not present

## 2023-02-18 DIAGNOSIS — F411 Generalized anxiety disorder: Secondary | ICD-10-CM | POA: Diagnosis not present
